# Patient Record
Sex: Female | Born: 1985 | Race: Black or African American | Hispanic: No | Marital: Single | Smoking: Never smoker
Health system: Southern US, Community
[De-identification: ages and names within clinical notes are randomized; demographics above are authoritative.]

## PROBLEM LIST (undated history)

## (undated) DIAGNOSIS — J45909 Unspecified asthma, uncomplicated: Secondary | ICD-10-CM

## (undated) DIAGNOSIS — N921 Excessive and frequent menstruation with irregular cycle: Secondary | ICD-10-CM

## (undated) HISTORY — PX: SKIN GRAFT: SHX250

## (undated) HISTORY — DX: Excessive and frequent menstruation with irregular cycle: N92.1

---
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
## 2004-08-22 ENCOUNTER — Emergency Department (HOSPITAL_COMMUNITY): Admission: EM | Admit: 2004-08-22 | Discharge: 2004-08-22 | Payer: Self-pay | Admitting: Family Medicine

## 2005-05-24 ENCOUNTER — Emergency Department (HOSPITAL_COMMUNITY): Admission: EM | Admit: 2005-05-24 | Discharge: 2005-05-25 | Payer: Self-pay | Admitting: Emergency Medicine

## 2006-11-14 ENCOUNTER — Emergency Department (HOSPITAL_COMMUNITY): Admission: EM | Admit: 2006-11-14 | Discharge: 2006-11-14 | Payer: Self-pay | Admitting: Emergency Medicine

## 2008-08-12 ENCOUNTER — Emergency Department (HOSPITAL_COMMUNITY): Admission: EM | Admit: 2008-08-12 | Discharge: 2008-08-12 | Payer: Self-pay | Admitting: Emergency Medicine

## 2010-03-09 ENCOUNTER — Emergency Department (HOSPITAL_COMMUNITY): Admission: EM | Admit: 2010-03-09 | Discharge: 2010-03-09 | Payer: Self-pay | Admitting: Emergency Medicine

## 2010-07-06 LAB — COMPREHENSIVE METABOLIC PANEL
ALT: 19 U/L (ref 0–35)
AST: 23 U/L (ref 0–37)
Calcium: 9.1 mg/dL (ref 8.4–10.5)
Chloride: 106 mEq/L (ref 96–112)
Creatinine, Ser: 0.85 mg/dL (ref 0.4–1.2)
GFR calc non Af Amer: >60 mL/min (ref >60)
Glucose, Bld: 93 mg/dL (ref 70–99)
Potassium: 4.2 mEq/L (ref 3.5–5.1)
Total Protein: 7.9 g/dL (ref 6.0–8.3)

## 2010-07-06 LAB — CBC
HCT: 37.4 % (ref 36.0–46.0)
MCHC: 33.6 g/dL (ref 30.0–36.0)
Platelets: 390 10*3/uL (ref 150–400)
RBC: 4.51 MIL/uL (ref 3.87–5.11)
RDW: 14.4 % (ref 11.5–15.5)
WBC: 5.2 10*3/uL (ref 4.0–10.5)

## 2010-07-06 LAB — DIFFERENTIAL
Basophils Absolute: 0 10*3/uL (ref 0.0–0.1)
Basophils Relative: 0 % (ref 0–1)
Eosinophils Relative: 1 % (ref 0–5)
Lymphs Abs: 1.8 10*3/uL (ref 0.7–4.0)
Monocytes Absolute: 0.4 10*3/uL (ref 0.1–1.0)
Neutro Abs: 3 10*3/uL (ref 1.7–7.7)

## 2010-07-06 LAB — URINALYSIS, ROUTINE W REFLEX MICROSCOPIC
Bilirubin Urine: NEGATIVE
Ketones, ur: NEGATIVE mg/dL
Protein, ur: 100 mg/dL — AB
pH: 8 (ref 5.0–8.0)

## 2010-07-06 LAB — URINE MICROSCOPIC-ADD ON

## 2010-07-06 LAB — LIPASE, BLOOD: Lipase: 30 U/L (ref 11–59)

## 2010-08-04 LAB — POCT I-STAT, CHEM 8
BUN: 12 mg/dL (ref 6–23)
Hemoglobin: 16 g/dL — ABNORMAL HIGH (ref 12.0–15.0)
Potassium: 3.3 mEq/L — ABNORMAL LOW (ref 3.5–5.1)
Sodium: 140 mEq/L (ref 135–145)
TCO2: 29 mmol/L (ref 0–100)

## 2010-08-04 LAB — POCT URINALYSIS DIP (DEVICE)
Nitrite: NEGATIVE
Urobilinogen, UA: 1 mg/dL (ref 0.0–1.0)
pH: 5.5 (ref 5.0–8.0)

## 2010-08-04 LAB — CBC
HCT: 42.5 % (ref 36.0–46.0)
Platelets: 395 10*3/uL (ref 150–400)

## 2010-08-04 LAB — POCT PREGNANCY, URINE: Preg Test, Ur: NEGATIVE

## 2010-08-04 LAB — DIFFERENTIAL
Basophils Absolute: 0 10*3/uL (ref 0.0–0.1)
Eosinophils Relative: 0 % (ref 0–5)
Lymphocytes Relative: 22 % (ref 12–46)
Monocytes Absolute: 0.6 10*3/uL (ref 0.1–1.0)
Monocytes Relative: 7 % (ref 3–12)
Neutrophils Relative %: 71 % (ref 43–77)

## 2013-03-29 ENCOUNTER — Other Ambulatory Visit (HOSPITAL_COMMUNITY): Payer: Self-pay | Admitting: *Deleted

## 2013-03-29 DIAGNOSIS — N6320 Unspecified lump in the left breast, unspecified quadrant: Secondary | ICD-10-CM

## 2013-04-02 ENCOUNTER — Ambulatory Visit (HOSPITAL_COMMUNITY): Payer: Self-pay

## 2013-04-04 ENCOUNTER — Other Ambulatory Visit: Payer: Self-pay

## 2016-07-12 ENCOUNTER — Encounter (HOSPITAL_COMMUNITY): Payer: Self-pay

## 2016-07-12 ENCOUNTER — Emergency Department (HOSPITAL_COMMUNITY)
Admission: EM | Admit: 2016-07-12 | Discharge: 2016-07-12 | Disposition: A | Discharge status: Home / Self Care | Payer: Self-pay | Attending: Emergency Medicine | Admitting: Emergency Medicine

## 2016-07-12 ENCOUNTER — Emergency Department (HOSPITAL_COMMUNITY): Payer: Self-pay

## 2016-07-12 DIAGNOSIS — J208 Acute bronchitis due to other specified organisms: Secondary | ICD-10-CM | POA: Insufficient documentation

## 2016-07-12 DIAGNOSIS — F17200 Nicotine dependence, unspecified, uncomplicated: Secondary | ICD-10-CM | POA: Insufficient documentation

## 2016-07-12 LAB — I-STAT TROPONIN, ED: Troponin i, poc: 0 ng/mL (ref 0.00–0.08)

## 2016-07-12 MED ORDER — ALBUTEROL SULFATE HFA 108 (90 BASE) MCG/ACT IN AERS: 2.0000 | INHALATION_SPRAY | RESPIRATORY_TRACT | Status: DC | PRN

## 2016-07-12 MED ORDER — ALBUTEROL SULFATE HFA 108 (90 BASE) MCG/ACT IN AERS
2.0000 | INHALATION_SPRAY | Freq: Once | RESPIRATORY_TRACT | Status: AC
  Administered 2016-07-12: 2 via RESPIRATORY_TRACT
  Filled 2016-07-12: qty 6.7

## 2016-07-12 NOTE — ED Triage Notes (Signed)
Patient complains of cough and congestion several days ago and now has chest pain with inspiration and movement and some back pain. NAD

## 2016-07-12 NOTE — ED Provider Notes (Signed)
[facility] Provider Note   CSN: [id number] Arrival date & time: 07/12/16  0757     History   Chief Complaint Chief Complaint  Patient presents with  . chest wall pain/ back pain    HPI Donna Carroll is a 31 y.o. female.  HPI  Pt presenting with c/o chest wall pain and upper back pain associated with coughing, nasal congestion and mild sore throat over the past several days.  Pt states she was initially concerned about a pulled muscle but became concerned that she had pneumonia.  No fever/chills.  No vomiting.  No weakness of arms. No difficulty breathing.  Hx of asthma as a child.  No nausea or diaphoresis.  Pain is worse with movement and different positions and with coughing.  No leg swelling. No hx of DVT/PE.  No recent travel/trauma/surgery- does not take OCPs.  Has not had any treatment prior to arrival.  There are no other associated systemic symptoms, there are no other alleviating or modifying factors.   History reviewed. No pertinent past medical history.  There are no active problems to display for this patient.   History reviewed. No pertinent surgical history.  OB History    No data available       Home Medications    Prior to Admission medications   Medication Sig Start Date End Date Taking? Authorizing Provider  ibuprofen (ADVIL,MOTRIN) 200 MG tablet Take 200 mg by mouth every 6 (six) hours as needed for mild pain.   Yes Historical Provider, MD    Family History No family history on file.  Social History Social History  Substance Use Topics  . Smoking status: Current Some Day Smoker  . Smokeless tobacco: Never Used  . Alcohol use Not on file     Allergies   Patient has no known allergies.   Review of Systems Review of Systems  ROS reviewed and all otherwise negative except for mentioned in HPI   Physical Exam Updated Vital Signs BP 112/78   Pulse 75   Temp 98.7 F (37.1 C) (Oral)   Resp 17   LMP 07/11/2016   SpO2 100%    Vitals reviewed Physical Exam  Physical Examination: General appearance - alert, well appearing, and in no distress Mental status - alert, oriented to person, place, and time Eyes - no conjunctival injection, no scleral icterus Mouth - mucous membranes moist, pharynx normal without lesions Neck - supple, no significant adenopathy Chest - clear to auscultation, no wheezes, rales or rhonchi, symmetric air entry Heart - normal rate, regular rhythm, normal S1, S2, no murmurs, rubs, clicks or gallops Abdomen - soft, nontender, nondistended, no masses or organomegaly Back- no midline or paraspinal tenderness to palpation of upper or lower back, no CVA tenderness Neurological - alert, oriented, normal speech Extremities - peripheral pulses normal, no pedal edema, no clubbing or cyanosis Skin - normal coloration and turgor, no rashes  ED Treatments / Results  Labs (all labs ordered are listed, but only abnormal results are displayed) Labs Reviewed  Rosezena Sensor, ED    EKG  EKG Interpretation  Date/Time:  Tuesday July 12 2016 08:00:44 EDT Ventricular Rate:  76 PR Interval:  130 QRS Duration: 74 QT Interval:  394 QTC Calculation: 443 R Axis:   29 Text Interpretation:  Normal sinus rhythm ST-t wave abnormality Abnormal ekg No significant change since last tracing Reconfirmed by [facility]  MD, Rondia Higginbotham [phone]) on 07/12/2016 9:55:42 AM       Radiology Dg Chest  2 View  Result Date: 07/12/2016 CLINICAL DATA:  Chest pain with cough and congestion EXAM: CHEST  2 VIEW COMPARISON:  May 24, 2005 FINDINGS: Lungs are clear. Heart size and pulmonary vascularity are normal. No adenopathy. No pneumothorax. No bone lesions. IMPRESSION: No edema or consolidation. Electronically Signed   By: Bretta BangWilliam  Woodruff III M.D.   On: 07/12/2016 08:35    Procedures Procedures (including critical care time)  Medications Ordered in ED Medications  albuterol (PROVENTIL HFA;VENTOLIN HFA) 108 (90 Base)  MCG/ACT inhaler 2 puff (2 puffs Inhalation Given 07/12/16 0956)     Initial Impression / Assessment and Plan / ED Course  I have reviewed the triage vital signs and the nursing notes.  Pertinent labs & imaging results that were available during my care of the patient were reviewed by me and considered in my medical decision making (see chart for details).     Pt presenting with c/o chest wall pain, upper back pain and cough, nasal congestion and sore throat.  She has no wheezing on exam, but given albuterol inhaler for cough.  Pt is PERC negative, very low risk for PE, heart score of zero, doubt ACS.  Discharged with strict return precautions.  Pt agreeable with plan.  Final Clinical Impressions(s) / ED Diagnoses   Final diagnoses:  Viral bronchitis    New Prescriptions Discharge Medication List as of 07/12/2016 10:17 AM       Jerelyn ScottMartha Linker, MD 07/12/16 [phone]

## 2016-07-12 NOTE — ED Notes (Signed)
Pt transporting to xray. NAD.  

## 2016-07-12 NOTE — Discharge Instructions (Signed)
Return to the ED with any concerns including difficulty breathing despite using albuterol every 4 hours, not drinking fluids, decreased urine output, vomiting and not able to keep down liquids or medications, decreased level of alertness/lethargy, or any other alarming symptoms °

## 2019-01-03 ENCOUNTER — Emergency Department (HOSPITAL_COMMUNITY)
Admission: EM | Admit: 2019-01-03 | Discharge: 2019-01-03 | Disposition: A | Discharge status: Home / Self Care | Payer: Self-pay | Attending: Pediatric Emergency Medicine | Admitting: Pediatric Emergency Medicine

## 2019-01-03 ENCOUNTER — Other Ambulatory Visit: Payer: Self-pay

## 2019-01-03 ENCOUNTER — Encounter (HOSPITAL_COMMUNITY): Payer: Self-pay | Admitting: Emergency Medicine

## 2019-01-03 DIAGNOSIS — K047 Periapical abscess without sinus: Secondary | ICD-10-CM | POA: Insufficient documentation

## 2019-01-03 DIAGNOSIS — K0889 Other specified disorders of teeth and supporting structures: Secondary | ICD-10-CM

## 2019-01-03 DIAGNOSIS — F17210 Nicotine dependence, cigarettes, uncomplicated: Secondary | ICD-10-CM | POA: Insufficient documentation

## 2019-01-03 DIAGNOSIS — K029 Dental caries, unspecified: Secondary | ICD-10-CM | POA: Insufficient documentation

## 2019-01-03 MED ORDER — AMOXICILLIN-POT CLAVULANATE 875-125 MG PO TABS: 1.0000 | ORAL_TABLET | Freq: Two times a day (BID) | ORAL | 0 refills | Status: AC

## 2019-01-03 MED ORDER — CHLORHEXIDINE GLUCONATE 0.12% ORAL RINSE (MEDLINE KIT): 15.0000 mL | Freq: Two times a day (BID) | OROMUCOSAL | 0 refills | Status: AC

## 2019-01-03 NOTE — ED Provider Notes (Signed)
[facility]   CSN: [id number] Arrival date & time: 01/03/19  2000     History   Chief Complaint Chief Complaint  Patient presents with  . Dental Pain    HPI Donna Carroll is a 33 y.o. female.     HPI   Patient is a 33 year old female who presents emergency department today for evaluation of right lower dental pain.  States 2 days ago she noted she had pain and swelling in the right lower jaw.  She has history of fractured tooth on that side.  She is tried interventions for symptoms.  She has had no fevers.  No difficulty opening her mouth or swallowing.  History reviewed. No pertinent past medical history.  There are no active problems to display for this patient.   History reviewed. No pertinent surgical history.   OB History   No obstetric history on file.      Home Medications    Prior to Admission medications   Medication Sig Start Date End Date Taking? Authorizing Provider  naproxen sodium (ALEVE) 220 MG tablet Take 220 mg by mouth as needed (pain).   Yes [provider]    Family History No family history on file.  Social History Social History   Tobacco Use  . Smoking status: Current Some Day Smoker  . Smokeless tobacco: Never Used  Substance Use Topics  . Alcohol use: Not on file  . Drug use: Yes    Types: Marijuana     Allergies   Patient has no known allergies.   Review of Systems Review of Systems  Constitutional: Negative for fever.  HENT: Positive for dental problem.      Physical Exam Updated Vital Signs BP (!) 160/88 (BP Location: Left Arm)   Pulse 76   Temp 98.7 F (37.1 C) (Oral)   Resp 16   SpO2 99%   Physical Exam Vitals signs and nursing note reviewed.  Constitutional:      General: She is not in acute distress.    Appearance: She is well-developed.  HENT:     Head: Normocephalic and atraumatic.     Mouth/Throat:     Comments: Right lower molar is  fractured with dental carie. Tooth is TTP. There is fluctuance to the right lower gumline that is also TTP. No sublingual or submandibular swelling. No trismus.  Eyes:     Conjunctiva/sclera: Conjunctivae normal.  Neck:     Musculoskeletal: Neck supple.  Cardiovascular:     Rate and Rhythm: Normal rate.  Pulmonary:     Effort: Pulmonary effort is normal.  Musculoskeletal: Normal range of motion.  Skin:    General: Skin is warm and dry.  Neurological:     Mental Status: She is alert.      ED Treatments / Results  Labs (all labs ordered are listed, but only abnormal results are displayed) Labs Reviewed - No data to display  EKG None  Radiology No results found.  Procedures .Marland Kitchen.Incision and Drainage  Date/Time: 01/03/2019 9:54 PM Performed by: Karrie Meresouture, Sharlon Pfohl S, PA-C Authorized by: Karrie Meresouture, Lanell Dubie S, PA-C   Consent:    Consent obtained:  Verbal   Consent given by:  Patient   Risks discussed:  Bleeding, incomplete drainage and pain   Alternatives discussed:  No treatment Location:    Type:  Abscess   Location:  Mouth Anesthesia (see MAR for exact dosages):    Anesthesia method:  Topical application   Topical  anesthetic:  Benzocaine gel Procedure type:    Complexity:  Simple Procedure details:    Needle aspiration: no     Incision types:  Stab incision   Incision depth:  Dermal   Scalpel blade:  11   Drainage:  Bloody   Drainage amount:  Scant   Wound treatment:  Wound left open Post-procedure details:    Patient tolerance of procedure:  Tolerated well, no immediate complications   (including critical care time)  Medications Ordered in ED Medications - No data to display   Initial Impression / Assessment and Plan / ED Course  I have reviewed the triage vital signs and the nursing notes.  Pertinent labs & imaging results that were available during my care of the patient were reviewed by me and considered in my medical decision making (see chart for  details).  Final Clinical Impressions(s) / ED Diagnoses   Final diagnoses:  None   Patient with toothache.  Abscess noted to the gumline. I&D attempted with scant bloody discharge noted. Exam unconcerning for Ludwig's angina or spread of infection.  Will treat with augmentin and pain medicine.  Urged patient to follow-up with dentist.     ED Discharge Orders    None       Bishop Dublin 01/03/19 2156    Brent Bulla, MD 01/03/19 2205

## 2019-01-03 NOTE — ED Notes (Signed)
Patient verbalizes understanding of discharge instructions. Opportunity for questioning and answers were provided. Armband removed by staff, pt discharged from ED ambulatory.   

## 2019-01-03 NOTE — Discharge Instructions (Signed)
You were given a prescription for antibiotics. Please take the antibiotic prescription fully.   Please follow-up with a dentist in the next 5 to 7 days for reevaluation.  If you do not have a dentist, resources were provided for dentist in the area in your discharge summary.  Please contact one of the offices that are listed and make an appointment for follow-up.  Please return to the emergency department for any new or worsening symptoms.  

## 2019-01-03 NOTE — ED Triage Notes (Signed)
Pt c/o dental pain and swelling to the right jaw x 2 days. States she has a hole in her tooth.

## 2019-03-08 ENCOUNTER — Ambulatory Visit (HOSPITAL_COMMUNITY)
Admission: EM | Admit: 2019-03-08 | Discharge: 2019-03-08 | Disposition: A | Discharge status: Home / Self Care | Payer: Self-pay | Attending: Emergency Medicine | Admitting: Emergency Medicine

## 2019-03-08 ENCOUNTER — Encounter (HOSPITAL_COMMUNITY): Payer: Self-pay | Admitting: Emergency Medicine

## 2019-03-08 ENCOUNTER — Other Ambulatory Visit: Payer: Self-pay

## 2019-03-08 DIAGNOSIS — Z3202 Encounter for pregnancy test, result negative: Secondary | ICD-10-CM

## 2019-03-08 DIAGNOSIS — N939 Abnormal uterine and vaginal bleeding, unspecified: Secondary | ICD-10-CM | POA: Insufficient documentation

## 2019-03-08 HISTORY — DX: Unspecified asthma, uncomplicated: J45.909

## 2019-03-08 LAB — CBC
HCT: 38 % (ref 36.0–46.0)
Hemoglobin: 12.1 g/dL (ref 12.0–15.0)
MCH: 25.7 pg — ABNORMAL LOW (ref 26.0–34.0)
MCHC: 31.8 g/dL (ref 30.0–36.0)
MCV: 80.9 fL (ref 80.0–100.0)
Platelets: 489 10*3/uL — ABNORMAL HIGH (ref 150–400)
RBC: 4.7 MIL/uL (ref 3.87–5.11)
RDW: 14.9 % (ref 11.5–15.5)
WBC: 8.1 10*3/uL (ref 4.0–10.5)
nRBC: 0 % (ref 0.0–0.2)

## 2019-03-08 LAB — POCT URINALYSIS DIP (DEVICE)
Bilirubin Urine: NEGATIVE
Glucose, UA: NEGATIVE mg/dL
Ketones, ur: NEGATIVE mg/dL
Nitrite: NEGATIVE
Protein, ur: NEGATIVE mg/dL
Specific Gravity, Urine: 1.02 (ref 1.005–1.030)
Urobilinogen, UA: 0.2 mg/dL (ref 0.0–1.0)
pH: 6 (ref 5.0–8.0)

## 2019-03-08 LAB — POCT PREGNANCY, URINE: Preg Test, Ur: NEGATIVE

## 2019-03-08 MED ORDER — NAPROXEN 500 MG PO TABS: 500.0000 mg | ORAL_TABLET | Freq: Two times a day (BID) | ORAL | 0 refills | Status: DC

## 2019-03-08 MED ORDER — FERROUS SULFATE 325 (65 FE) MG PO TABS: 325.0000 mg | ORAL_TABLET | Freq: Every day | ORAL | 0 refills | Status: DC

## 2019-03-08 NOTE — ED Triage Notes (Addendum)
Patient reports she has vaginal bleeding. Patient reports bleeding started September 29.  Patient reports having bled every single day since onset.  Patient feels weak and lightheaded.  3 days ago had right lower quadrant discomfort.

## 2019-03-08 NOTE — Discharge Instructions (Signed)
I will call you if there is any concerning findings on your blood test or you vaginal swab. The vaginal swab does take 2-3 days to result.  You may use naproxen twice a day as needed to help with may, may help decrease your bleeding some.  Iron supplementation may also be helpful, may cause constipation.  If any worsening of pain, weakness, fevers, passing out or otherwise worsening please go to the ER.  Please follow up with gynecology for recheck as may need further evaluation and management if this persists.

## 2019-03-08 NOTE — ED Provider Notes (Signed)
[facility]    CSN: [id number] Arrival date & time: 03/08/19  1730      History   Chief Complaint Chief Complaint  Patient presents with  . Vaginal Bleeding    HPI ROME SCHLAUCH is a 33 y.o. female.   ADAHLIA STEMBRIDGE presents with complaints of persistent vaginal bleeding. LMP was 9/29, but bleeding never stopped. She states it was heavy bleeding for approximately 2-3 weeks and then decreased to "normal period" amount of daily  Bleeding. Today the bleeding is the lightest it has been yet. Some RLQ pain for the past three days. No worsening of this. No nausea or vomiting. No diarrhea or constipation. No fevers. Hasn't tried any medications for pain. She can feel weak and light headed at night. No LOC or syncope ever, however. No other vaginal discharge or odor. Denies any previous similar. She states she is not currently sexually active. She is not on any form of birth control. Denies any urinary symptoms. Some mild back ache. She has appointment with gynecology on 12/2.     ROS per HPI, negative if not otherwise mentioned.      Past Medical History:  Diagnosis Date  . Asthma     There are no active problems to display for this patient.   Past Surgical History:  Procedure Laterality Date  . SKIN GRAFT Right    right hand    OB History   No obstetric history on file.      Home Medications    Prior to Admission medications   Medication Sig Start Date End Date Taking? Authorizing Provider  ferrous sulfate 325 (65 FE) MG tablet Take 1 tablet (325 mg total) by mouth daily. 03/08/19   Georgetta Haber, NP  naproxen (NAPROSYN) 500 MG tablet Take 1 tablet (500 mg total) by mouth 2 (two) times daily. 03/08/19   Georgetta Haber, NP    Family History Family History  Problem Relation Age of Onset  . Diabetes Mother   . Hypertension Mother     Social History Social History   Tobacco Use  . Smoking status: Never Smoker  . Smokeless tobacco: Never  Used  Substance Use Topics  . Alcohol use: Yes  . Drug use: Yes    Types: Marijuana     Allergies   Patient has no known allergies.   Review of Systems Review of Systems   Physical Exam Triage Vital Signs ED Triage Vitals  Enc Vitals Group     BP 03/08/19 1805 110/62     Pulse Rate 03/08/19 1805 82     Resp 03/08/19 1805 18     Temp 03/08/19 1805 98.4 F (36.9 C)     Temp Source 03/08/19 1805 Oral     SpO2 03/08/19 1805 100 %     Weight --      Height --      Head Circumference --      Peak Flow --      Pain Score 03/08/19 1800 7     Pain Loc --      Pain Edu? --      Excl. in GC? --    No data found.  Updated Vital Signs BP 110/62 (BP Location: Right Arm) Comment (BP Location): large cuff  Pulse 82   Temp 98.4 F (36.9 C) (Oral)   Resp 18   LMP 12/05/2018 Comment: patient reports she had her first period in second grade/33 years old  SpO2 100%  Physical Exam Constitutional:      General: She is not in acute distress.    Appearance: She is well-developed.  Cardiovascular:     Rate and Rhythm: Normal rate.  Pulmonary:     Effort: Pulmonary effort is normal.  Abdominal:     Tenderness: There is abdominal tenderness in the right lower quadrant. There is no right CVA tenderness, left CVA tenderness, guarding or rebound.       Comments: Generalized mild RLQ tenderness with fullness palpable although no specific palpable masses; mild pain without rebound or guarding; no straight leg strike pain and full movement of abdomen without increased pain  Skin:    General: Skin is warm and dry.  Neurological:     Mental Status: She is alert and oriented to person, place, and time.      UC Treatments / Results  Labs (all labs ordered are listed, but only abnormal results are displayed) Labs Reviewed  POCT URINALYSIS DIP (DEVICE) - Abnormal; Notable for the following components:      Result Value   Hgb urine dipstick LARGE (*)    Leukocytes,Ua TRACE (*)     All other components within normal limits  URINE CULTURE  CBC  POC URINE PREG, ED    EKG   Radiology No results found.  Procedures Procedures (including critical care time)  Medications Ordered in UC Medications - No data to display  Initial Impression / Assessment and Plan / UC Course  I have reviewed the triage vital signs and the nursing notes.  Pertinent labs & imaging results that were available during my care of the patient were reviewed by me and considered in my medical decision making (see chart for details).     Abnormal vaginal bleeding, no sexual activity, not on birth control. Cbc pending. Encouraged use of naproxen, iron supplementation. Bleeding has improved today. I do recommend further evaluation with gynecology. Return precautions provided. Patient verbalized understanding and agreeable to plan.   Final Clinical Impressions(s) / UC Diagnoses   Final diagnoses:  Abnormal uterine bleeding (AUB)     Discharge Instructions     I will call you if there is any concerning findings on your blood test or you vaginal swab. The vaginal swab does take 2-3 days to result.  You may use naproxen twice a day as needed to help with may, may help decrease your bleeding some.  Iron supplementation may also be helpful, may cause constipation.  If any worsening of pain, weakness, fevers, passing out or otherwise worsening please go to the ER.  Please follow up with gynecology for recheck as may need further evaluation and management if this persists.    ED Prescriptions    Medication Sig Dispense Auth. Provider   naproxen (NAPROSYN) 500 MG tablet Take 1 tablet (500 mg total) by mouth 2 (two) times daily. 30 tablet Augusto Gamble B, NP   ferrous sulfate 325 (65 FE) MG tablet Take 1 tablet (325 mg total) by mouth daily. 30 tablet Zigmund Gottron, NP     PDMP not reviewed this encounter.   Zigmund Gottron, NP 03/08/19 1840

## 2019-03-10 LAB — URINE CULTURE: Culture: 20000 — AB

## 2019-03-12 LAB — CERVICOVAGINAL ANCILLARY ONLY
Bacterial vaginitis: NEGATIVE
Candida vaginitis: NEGATIVE
Chlamydia: NEGATIVE
Neisseria Gonorrhea: NEGATIVE
Trichomonas: NEGATIVE

## 2019-03-26 ENCOUNTER — Telehealth: Payer: Self-pay | Admitting: Family Medicine

## 2019-03-26 ENCOUNTER — Telehealth: Payer: Self-pay

## 2019-03-26 NOTE — Telephone Encounter (Signed)
Attempted to call patient about changes made in the office. I was not able to leave a message because her voice mailbox was full.

## 2019-03-26 NOTE — Telephone Encounter (Signed)
Pt called stating that she has an appt tomorrow for long periods and she is still bleeding does need to come to come to the appt.  Notified pt that she needs to come to her appt scheduled on 03/27/19 @ 1430 so that the provider will be able to see how much she is bleeding.  I strongly encouraged pt to come to her appt for management.  Pt verbalized understanding with no further questions.   Mel Almond, RN 03/26/19

## 2019-03-27 ENCOUNTER — Encounter: Payer: Self-pay | Admitting: Obstetrics & Gynecology

## 2019-03-27 ENCOUNTER — Ambulatory Visit (INDEPENDENT_AMBULATORY_CARE_PROVIDER_SITE_OTHER): Payer: Self-pay | Admitting: Obstetrics & Gynecology

## 2019-03-27 ENCOUNTER — Other Ambulatory Visit: Payer: Self-pay

## 2019-03-27 VITALS — BP 118/80 | HR 77 | Ht 64.0 in | Wt 203.2 lb

## 2019-03-27 DIAGNOSIS — N938 Other specified abnormal uterine and vaginal bleeding: Secondary | ICD-10-CM

## 2019-03-27 DIAGNOSIS — N939 Abnormal uterine and vaginal bleeding, unspecified: Secondary | ICD-10-CM | POA: Insufficient documentation

## 2019-03-27 MED ORDER — MEDROXYPROGESTERONE ACETATE 10 MG PO TABS: 20.0000 mg | ORAL_TABLET | Freq: Every day | ORAL | 2 refills | Status: DC

## 2019-03-27 NOTE — Progress Notes (Signed)
Patient ID: Donna Carroll, female   DOB: Apr 26, 1985, 33 y.o.   MRN: [id number]  Chief Complaint  Patient presents with  . Gynecologic Exam   F/u for DUB for 9 weeks HPI Donna Carroll is a 33 y.o. female.  [id number] Patient's last menstrual period was 01/22/2019 (exact date). Her bleeding has persisted but is lighter now, with mild cramps. She states she doesn't need BCM, never tried to conceive HPI  Past Medical History:  Diagnosis Date  . Asthma     Past Surgical History:  Procedure Laterality Date  . SKIN GRAFT Right    right hand    Family History  Problem Relation Age of Onset  . Diabetes Mother   . Hypertension Mother     Social History Social History   Tobacco Use  . Smoking status: Never Smoker  . Smokeless tobacco: Never Used  Substance Use Topics  . Alcohol use: Yes  . Drug use: Yes    Types: Marijuana    No Known Allergies  Current Outpatient Medications  Medication Sig Dispense Refill  . ferrous sulfate 325 (65 FE) MG tablet Take 1 tablet (325 mg total) by mouth daily. (Patient not taking: Reported on 03/27/2019) 30 tablet 0  . naproxen (NAPROSYN) 500 MG tablet Take 1 tablet (500 mg total) by mouth 2 (two) times daily. (Patient not taking: Reported on 03/27/2019) 30 tablet 0   No current facility-administered medications for this visit.     Review of Systems Review of Systems  Constitutional: Negative.   Gastrointestinal: Positive for diarrhea.  Endocrine:       Facial hirsutism  Genitourinary: Positive for menstrual problem. Negative for vaginal discharge.    Blood pressure 118/80, pulse 77, height 5\' 4"  (1.626 m), weight 203 lb 3.2 oz (92.2 kg), last menstrual period 01/22/2019.  Physical Exam Physical Exam Vitals signs and nursing note reviewed. Exam conducted with a chaperone present Donna Carroll).  Constitutional:      Appearance: She is obese. She is not ill-appearing.  Pulmonary:     Effort: Pulmonary effort is normal.  Abdominal:   General: There is no distension.  Genitourinary:    Comments: Pelvic exam: blood present,normal external genitalia, vulva, vagina, cervix, uterus and adnexa.  Neurological:     Mental Status: She is alert.     Data Reviewed CBC    Component Value Date/Time   WBC 8.1 03/08/2019 1810   RBC 4.70 03/08/2019 1810   HGB 12.1 03/08/2019 1810   HCT 38.0 03/08/2019 1810   PLT 489 (H) 03/08/2019 1810   MCV 80.9 03/08/2019 1810   MCH 25.7 (L) 03/08/2019 1810   MCHC 31.8 03/08/2019 1810   RDW 14.9 03/08/2019 1810   LYMPHSABS 1.8 03/09/2010 1300   MONOABS 0.4 03/09/2010 1300   EOSABS 0.0 03/09/2010 1300   BASOSABS 0.0 03/09/2010 1300     Assessment Patient Active Problem List   Diagnosis Date Noted  . DUB (dysfunctional uterine bleeding) 03/27/2019    Plan Pelvic US Provera for 30 days po RTC 6 weeks    Donna Carroll 03/27/2019, 3:08 PM

## 2019-04-01 ENCOUNTER — Encounter: Payer: Self-pay | Admitting: Family Medicine

## 2019-04-02 ENCOUNTER — Other Ambulatory Visit: Payer: Self-pay

## 2019-04-02 ENCOUNTER — Ambulatory Visit (HOSPITAL_COMMUNITY)
Admission: RE | Admit: 2019-04-02 | Discharge: 2019-04-02 | Disposition: A | Discharge status: Home / Self Care | Payer: Self-pay | Source: Ambulatory Visit | Attending: Obstetrics & Gynecology | Admitting: Obstetrics & Gynecology

## 2019-04-02 DIAGNOSIS — N938 Other specified abnormal uterine and vaginal bleeding: Secondary | ICD-10-CM | POA: Insufficient documentation

## 2019-04-12 ENCOUNTER — Encounter: Payer: Self-pay | Admitting: *Deleted

## 2019-05-31 ENCOUNTER — Encounter: Payer: Self-pay | Admitting: Obstetrics & Gynecology

## 2019-05-31 ENCOUNTER — Ambulatory Visit: Payer: Self-pay | Admitting: Obstetrics & Gynecology

## 2019-08-11 ENCOUNTER — Other Ambulatory Visit: Payer: Self-pay

## 2019-08-11 ENCOUNTER — Emergency Department (HOSPITAL_COMMUNITY): Payer: Medicaid Other

## 2019-08-11 ENCOUNTER — Emergency Department (HOSPITAL_COMMUNITY)
Admission: EM | Admit: 2019-08-11 | Discharge: 2019-08-11 | Disposition: A | Discharge status: Home / Self Care | Payer: Medicaid Other | Attending: Emergency Medicine | Admitting: Emergency Medicine

## 2019-08-11 ENCOUNTER — Encounter (HOSPITAL_COMMUNITY): Payer: Self-pay | Admitting: Emergency Medicine

## 2019-08-11 DIAGNOSIS — G8921 Chronic pain due to trauma: Secondary | ICD-10-CM | POA: Insufficient documentation

## 2019-08-11 DIAGNOSIS — M25512 Pain in left shoulder: Secondary | ICD-10-CM | POA: Insufficient documentation

## 2019-08-11 DIAGNOSIS — W1789XD Other fall from one level to another, subsequent encounter: Secondary | ICD-10-CM | POA: Insufficient documentation

## 2019-08-11 MED ORDER — NAPROXEN 500 MG PO TABS: 500.0000 mg | ORAL_TABLET | Freq: Two times a day (BID) | ORAL | 0 refills | Status: DC

## 2019-08-11 NOTE — ED Notes (Signed)
Patient verbalizes understanding of discharge instructions . Opportunity for questions and answers were provided . Armband removed by staff ,Pt discharged from ED. W/C  offered at D/C  and Declined W/C at D/C and was escorted to lobby by RN.  

## 2019-08-11 NOTE — Discharge Instructions (Addendum)
Get help right away if: Your arm, hand, or fingers: Tingle. Become numb. Become swollen. Become painful. Turn white or blue. 

## 2019-08-11 NOTE — ED Triage Notes (Signed)
C/o L shoulder pain since falling off a [employer] ramp on 3/26.  States she hasn't had it x-rayed since fall.

## 2019-08-11 NOTE — ED Provider Notes (Signed)
[facility] EMERGENCY DEPARTMENT Provider Note   CSN: [id number] Arrival date & time: 08/11/19  1412     History Chief Complaint  Patient presents with  . Shoulder Pain    Donna Carroll is a 34 y.o. female who injured her left shoulder 1 month ago.  Patient fell off of a hand and she states that she felt her left shoulder pop upward when S3, extended arm.  Since that time she has had pain in the supraclavicular region and occasional feels sharp, tingling pain rating down the front of her arm.  She denies any weakness, loss of grip strength.  She has been taking naproxen which has been helpful.  Chest pain or shortness of breath.  HPI     Past Medical History:  Diagnosis Date  . Asthma     Patient Active Problem List   Diagnosis Date Noted  . DUB (dysfunctional uterine bleeding) 03/27/2019    Past Surgical History:  Procedure Laterality Date  . SKIN GRAFT Right    right hand     OB History    Gravida  0   Para  0   Term  0   Preterm  0   AB  0   Living  0     SAB  0   TAB  0   Ectopic  0   Multiple  0   Live Births  0           Family History  Problem Relation Age of Onset  . Diabetes Mother   . Hypertension Mother     Social History   Tobacco Use  . Smoking status: Never Smoker  . Smokeless tobacco: Never Used  Substance Use Topics  . Alcohol use: Yes  . Drug use: Yes    Types: Marijuana    Home Medications Prior to Admission medications   Medication Sig Start Date End Date Taking? Authorizing Provider  ferrous sulfate 325 (65 FE) MG tablet Take 1 tablet (325 mg total) by mouth daily. Patient not taking: Reported on 03/27/2019 03/08/19   Linus Mako B, NP  medroxyPROGESTERone (PROVERA) 10 MG tablet Take 2 tablets (20 mg total) by mouth daily. For 30 days 03/27/19   Adam Phenix, MD  naproxen (NAPROSYN) 500 MG tablet Take 1 tablet (500 mg total) by mouth 2 (two) times daily. Patient not taking: Reported on  03/27/2019 03/08/19   Georgetta Haber, NP    Allergies    Patient has no known allergies.  Review of Systems   Review of Systems  Constitutional: Negative for fatigue.  Respiratory: Negative for cough.   Cardiovascular: Negative for chest pain.  Musculoskeletal:       Left shoulder pain  Neurological: Negative for weakness and numbness.      Physical Exam Updated Vital Signs BP 133/88 (BP Location: Right Arm)   Pulse 98   Temp 98.8 F (37.1 C) (Oral)   Resp 14   LMP 07/21/2019   SpO2 99%   Physical Exam Vitals and nursing note reviewed.  Constitutional:      General: She is not in acute distress.    Appearance: She is well-developed. She is not diaphoretic.  HENT:     Head: Normocephalic and atraumatic.  Eyes:     General: No scleral icterus.    Conjunctiva/sclera: Conjunctivae normal.  Cardiovascular:     Rate and Rhythm: Normal rate and regular rhythm.     Heart sounds: Normal heart sounds.  No murmur. No friction rub. No gallop.   Pulmonary:     Effort: Pulmonary effort is normal. No respiratory distress.     Breath sounds: Normal breath sounds.  Abdominal:     General: Bowel sounds are normal. There is no distension.     Palpations: Abdomen is soft. There is no mass.     Tenderness: There is no abdominal tenderness. There is no guarding.  Musculoskeletal:     Cervical back: Normal range of motion.     Comments: Normal radial pulses bilaterally, strong equal grip strength.  No bony tenderness to palpation along the clavicle, scapula or coracoid process.  Full range of motion of bilateral shoulders, pain with internal rotation and reaching overhead on the left.  There is tenderness to palpation in the supraclavicular region.  No swelling bruising or other abnormalities  Skin:    General: Skin is warm and dry.  Neurological:     Mental Status: She is alert and oriented to person, place, and time.  Psychiatric:        Behavior: Behavior normal.     ED Results  / Procedures / Treatments   Labs (all labs ordered are listed, but only abnormal results are displayed) Labs Reviewed - No data to display  EKG None  Radiology DG Shoulder Left  Result Date: 08/11/2019 CLINICAL DATA:  Left shoulder pain since 07/19/2019, fell off U whole ramp. Patient reports scapular pain. EXAM: LEFT SHOULDER - 2+ VIEW COMPARISON:  None. FINDINGS: There is no evidence of fracture or dislocation. There is no evidence of arthropathy or other focal bone abnormality. Soft tissues are unremarkable. IMPRESSION: Negative radiographs of the left shoulder. Electronically Signed   By: Keith Rake M.D.   On: 08/11/2019 15:07    Procedures Procedures (including critical care time)  Medications Ordered in ED Medications - No data to display  ED Course  I have reviewed the triage vital signs and the nursing notes.  Pertinent labs & imaging results that were available during my care of the patient were reviewed by me and considered in my medical decision making (see chart for details).    MDM Rules/Calculators/A&P                     Patient here with left shoulder injury that occurred 1 month ago.  I personally interpreted and reviewed images of the left shoulder which showed no acute abnormalities. I suspect the patient actually had a strain or partial tear of one of her left scalene muscles given the mechanism of her injury and tenderness in the supraclavicular region.  I recommended supportive care, RICE and close outpatient follow-up with a sports medicine doctor.  Discussed return precautions.  Appears appropriate for discharge at this time.   Final Clinical Impression(s) / ED Diagnoses Final diagnoses:  None    Rx / DC Orders ED Discharge Orders    None       Donna Mail, PA-C 08/11/19 1705    Tegeler, Gwenyth Allegra, MD 08/12/19 [phone]

## 2019-09-25 ENCOUNTER — Other Ambulatory Visit: Payer: Self-pay

## 2019-09-25 ENCOUNTER — Ambulatory Visit (INDEPENDENT_AMBULATORY_CARE_PROVIDER_SITE_OTHER): Payer: 59 | Admitting: Family Medicine

## 2019-09-25 VITALS — BP 134/86 | Ht 63.0 in | Wt 175.0 lb

## 2019-09-25 DIAGNOSIS — M25512 Pain in left shoulder: Secondary | ICD-10-CM

## 2019-09-25 NOTE — Patient Instructions (Signed)
You have strained and bruised your rotator cuff. Try to avoid painful activities (overhead activities, lifting with extended arm) as much as possible. When you run out of the naproxen you can take Aleve 2 tabs twice a day with food. Can take tylenol in addition to this. Consider physical therapy with transition to home exercise program. Do home exercise program with theraband and scapular stabilization exercises daily 3 sets of 10 once a day. If not improving at follow-up we will consider further imaging, injection, physical therapy, and/or nitro patches. Follow up with me in 5-6 weeks but call me sooner if you want to do physical therapy.

## 2019-09-26 ENCOUNTER — Encounter: Payer: Self-pay | Admitting: Family Medicine

## 2019-09-26 NOTE — Progress Notes (Signed)
PCP: Patient, No Pcp Per  Subjective:   HPI: Patient is a 34 y.o. female here for left shoulder pain.  Patient reports on 3/26 she was on a U-haul ramp that was slippery, she slipped and jammed her left shoulder with feeling that 'shoulder went up' in superior direction. No prior injuries to this shoulder. She is left handed. Worse with overhead motions. Had radiographs in urgent care that were normal. No radiation into left upper extremity, numbness.  Past Medical History:  Diagnosis Date   Asthma     Current Outpatient Medications on File Prior to Visit  Medication Sig Dispense Refill   ferrous sulfate 325 (65 FE) MG tablet Take 1 tablet (325 mg total) by mouth daily. (Patient not taking: Reported on 03/27/2019) 30 tablet 0   medroxyPROGESTERone (PROVERA) 10 MG tablet Take 2 tablets (20 mg total) by mouth daily. For 30 days 60 tablet 2   naproxen (NAPROSYN) 500 MG tablet Take 1 tablet (500 mg total) by mouth 2 (two) times daily. 30 tablet 0   No current facility-administered medications on file prior to visit.    Past Surgical History:  Procedure Laterality Date   SKIN GRAFT Right    right hand    No Known Allergies  Social History   Socioeconomic History   Marital status: Single    Spouse name: Not on file   Number of children: Not on file   Years of education: Not on file   Highest education level: Not on file  Occupational History   Not on file  Tobacco Use   Smoking status: Never Smoker   Smokeless tobacco: Never Used  Substance and Sexual Activity   Alcohol use: Yes   Drug use: Yes    Types: Marijuana   Sexual activity: Not on file  Other Topics Concern   Not on file  Social History Narrative   Not on file   Social Determinants of Health   Financial Resource Strain:    Difficulty of Paying Living Expenses:   Food Insecurity:    Worried About Charity fundraiser in the Last Year:    Arboriculturist in the Last Year:    Transportation Needs:    Film/video editor (Medical):    Lack of Transportation (Non-Medical):   Physical Activity:    Days of Exercise per Week:    Minutes of Exercise per Session:   Stress:    Feeling of Stress :   Social Connections:    Frequency of Communication with Friends and Family:    Frequency of Social Gatherings with Friends and Family:    Attends Religious Services:    Active Member of Clubs or Organizations:    Attends Music therapist:    Marital Status:   Intimate Partner Violence:    Fear of Current or Ex-Partner:    Emotionally Abused:    Physically Abused:    Sexually Abused:     Family History  Problem Relation Age of Onset   Diabetes Mother    Hypertension Mother     BP 134/86    Ht 5\' 3"  (1.6 m)    Wt 175 lb (79.4 kg)    BMI 31.00 kg/m   Review of Systems: See HPI above.     Objective:  Physical Exam:  Gen: NAD, comfortable in exam room  Left shoulder: No swelling, ecchymoses.  No gross deformity. No TTP. FROM with mild painful arc. Negative Hawkins, Neers. Negative Yergasons. Strength 5/5  with empty can and resisted internal/external rotation.  Pain with empty can and ER. Negative apprehension. Negative sulcus, o'briens. NV intact distally.   Assessment & Plan:  1. Left shoulder pain - radiographs negative for bony abnormalities.  Exam is reassuring against rotator cuff tear though consistent with strain, contusion of supraspinatus and infraspinatus.  Start home exercise program daily.  Naproxen, tylenol.  Consider physical therapy, ultrasound if not improving as next steps.  F/u in 5-6 weeks.

## 2019-10-30 ENCOUNTER — Ambulatory Visit: Payer: 59 | Admitting: Family Medicine

## 2019-11-06 ENCOUNTER — Ambulatory Visit (INDEPENDENT_AMBULATORY_CARE_PROVIDER_SITE_OTHER): Payer: 59 | Admitting: Family Medicine

## 2019-11-06 ENCOUNTER — Other Ambulatory Visit: Payer: Self-pay

## 2019-11-06 VITALS — BP 118/80 | Ht 64.0 in | Wt 178.0 lb

## 2019-11-06 DIAGNOSIS — M25512 Pain in left shoulder: Secondary | ICD-10-CM | POA: Diagnosis not present

## 2019-11-06 MED ORDER — DICLOFENAC SODIUM 75 MG PO TBEC: 75.0000 mg | DELAYED_RELEASE_TABLET | Freq: Two times a day (BID) | ORAL | 1 refills | Status: DC

## 2019-11-06 NOTE — Patient Instructions (Signed)
Try to avoid painful activities (overhead activities, lifting with extended arm) as much as possible. Diclofenac 75mg  twice a day with food only if needed. Can take tylenol in addition to this. Consider physical therapy with transition to home exercise program. Do home exercise program with theraband and scapular stabilization exercises daily 3 sets of 10 once a day. If not improving at follow-up we will consider imaging, injection, physical therapy, and/or nitro patches. Follow up with me in 6 weeks but call me sooner if you want to do physical therapy.

## 2019-11-07 ENCOUNTER — Encounter: Payer: Self-pay | Admitting: Family Medicine

## 2019-11-07 NOTE — Progress Notes (Signed)
PCP: Patient, No Pcp Per  Subjective:   HPI: Patient is a 34 y.o. female here for left shoulder pain.  6/2: Patient reports on 3/26 she was on a U-haul ramp that was slippery, she slipped and jammed her left shoulder with feeling that 'shoulder went up' in superior direction. No prior injuries to this shoulder. She is left handed. Worse with overhead motions. Had radiographs in urgent care that were normal. No radiation into left upper extremity, numbness.  7/14: Patient reports she's improved compared to last visit. Doing home exercises. Pain worse in evenings and feels she needs medication then to help her get to sleep. Sometimes waking her up at night. Overall pleased with her progress.  Past Medical History:  Diagnosis Date   Asthma     Current Outpatient Medications on File Prior to Visit  Medication Sig Dispense Refill   ferrous sulfate 325 (65 FE) MG tablet Take 1 tablet (325 mg total) by mouth daily. (Patient not taking: Reported on 03/27/2019) 30 tablet 0   medroxyPROGESTERone (PROVERA) 10 MG tablet Take 2 tablets (20 mg total) by mouth daily. For 30 days 60 tablet 2   naproxen (NAPROSYN) 500 MG tablet Take 1 tablet (500 mg total) by mouth 2 (two) times daily. 30 tablet 0   No current facility-administered medications on file prior to visit.    Past Surgical History:  Procedure Laterality Date   SKIN GRAFT Right    right hand    No Known Allergies  Social History   Socioeconomic History   Marital status: Single    Spouse name: Not on file   Number of children: Not on file   Years of education: Not on file   Highest education level: Not on file  Occupational History   Not on file  Tobacco Use   Smoking status: Never Smoker   Smokeless tobacco: Never Used  Substance and Sexual Activity   Alcohol use: Yes   Drug use: Yes    Types: Marijuana   Sexual activity: Not on file  Other Topics Concern   Not on file  Social History Narrative    Not on file   Social Determinants of Health   Financial Resource Strain:    Difficulty of Paying Living Expenses:   Food Insecurity:    Worried About Programme researcher, broadcasting/film/video in the Last Year:    Barista in the Last Year:   Transportation Needs:    Freight forwarder (Medical):    Lack of Transportation (Non-Medical):   Physical Activity:    Days of Exercise per Week:    Minutes of Exercise per Session:   Stress:    Feeling of Stress :   Social Connections:    Frequency of Communication with Friends and Family:    Frequency of Social Gatherings with Friends and Family:    Attends Religious Services:    Active Member of Clubs or Organizations:    Attends Engineer, structural:    Marital Status:   Intimate Partner Violence:    Fear of Current or Ex-Partner:    Emotionally Abused:    Physically Abused:    Sexually Abused:     Family History  Problem Relation Age of Onset   Diabetes Mother    Hypertension Mother     BP 118/80    Ht 5\' 4"  (1.626 m)    Wt 178 lb (80.7 kg)    BMI 30.55 kg/m   Review of Systems: See  HPI above.     Objective:  Physical Exam:  Gen: NAD, comfortable in exam room  Left shoulder: No swelling, ecchymoses.  No gross deformity. No TTP. FROM. Negative Hawkins, Neers. Negative Yergasons. Strength 5/5 with empty can and resisted internal/external rotation.  Mild pain ER. NV intact distally.   Assessment & Plan:  1. Left shoulder pain - mild improvement compared to last visit.  Pain primarily with external rotation now.  Radiographs were negative.  She would like to continue with home exercise program.  Add diclofenac twice a day if needed.  Consider physical therapy, injection, nitro patches if not improving.  F/u in 6 weeks.

## 2019-12-02 ENCOUNTER — Telehealth: Payer: Self-pay | Admitting: General Practice

## 2019-12-02 NOTE — Telephone Encounter (Signed)
Patient is requesting a note for work. She works at [employer] in and out. Can she get a note for work? She was seen here in July and is scheduled toward the end of the month I believe. Thanks

## 2019-12-03 ENCOUNTER — Telehealth: Payer: Self-pay | Admitting: General Practice

## 2019-12-03 ENCOUNTER — Encounter: Payer: Self-pay | Admitting: *Deleted

## 2019-12-03 NOTE — Telephone Encounter (Signed)
Patient called in stating she had a voicemail and is calling back.The voicemail was about a work for note I believe. Thanks.

## 2019-12-18 ENCOUNTER — Other Ambulatory Visit: Payer: Self-pay

## 2019-12-18 ENCOUNTER — Ambulatory Visit (INDEPENDENT_AMBULATORY_CARE_PROVIDER_SITE_OTHER): Payer: 59 | Admitting: Family Medicine

## 2019-12-18 VITALS — BP 96/68 | Ht 64.0 in | Wt 173.0 lb

## 2019-12-18 DIAGNOSIS — M25512 Pain in left shoulder: Secondary | ICD-10-CM | POA: Diagnosis not present

## 2019-12-18 DIAGNOSIS — G2589 Other specified extrapyramidal and movement disorders: Secondary | ICD-10-CM | POA: Diagnosis not present

## 2019-12-18 NOTE — Patient Instructions (Addendum)
Thank you for coming in to see [place] today! Please see below to review our plan for today's visit:   1.   Please plan to go to formal physical therapy for scapular dyskinesis and your rotator cuff. 2.   You may continue diclofenac or ibuprofen as needed for pain 3.   Please plan to follow-up with [place] in 6 weeks   Please call the clinic at [phone] if your symptoms worsen or you have any concerns. It was our pleasure to serve you.       Dr. Guy Sandifer Dr. Norton Blizzard  [facility]

## 2019-12-18 NOTE — Progress Notes (Signed)
PCP: Patient, No Pcp Per  Subjective:   HPI: Patient is a 34 y.o. female here for reevaluation of left shoulder pain.  She was last seen on 11/06/2019, was being treated conservatively with home exercise and anti-inflammatories.  She was having improvement however after she returned to work after that last visit, she has had worsening pain.  She reports that the pain is mostly over her lateral shoulder and extends up into her trapezius area.  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  She tried the oral diclofenac and did not find it very helpful so stopped taking it.  She also has some occasional numbness and tingling in her arm and hand in a nondermatomal pattern.  She also notes that she feels like there is a bump on her shoulder when she lays on her back.  No new trauma or injury.   Past Medical History:  Diagnosis Date  . Asthma     Current Outpatient Medications on File Prior to Visit  Medication Sig Dispense Refill  . diclofenac (VOLTAREN) 75 MG EC tablet Take 1 tablet (75 mg total) by mouth 2 (two) times daily. 60 tablet 1  . ferrous sulfate 325 (65 FE) MG tablet Take 1 tablet (325 mg total) by mouth daily. (Patient not taking: Reported on 03/27/2019) 30 tablet 0  . medroxyPROGESTERone (PROVERA) 10 MG tablet Take 2 tablets (20 mg total) by mouth daily. For 30 days 60 tablet 2  . naproxen (NAPROSYN) 500 MG tablet Take 1 tablet (500 mg total) by mouth 2 (two) times daily. 30 tablet 0   No current facility-administered medications on file prior to visit.    Past Surgical History:  Procedure Laterality Date  . SKIN GRAFT Right    right hand    No Known Allergies  Social History   Socioeconomic History  . Marital status: Single    Spouse name: Not on file  . Number of children: Not on file  . Years of education: Not on file  . Highest education level: Not on file  Occupational History  . Not on file  Tobacco Use   . Smoking status: Never Smoker  . Smokeless tobacco: Never Used  Substance and Sexual Activity  . Alcohol use: Yes  . Drug use: Yes    Types: Marijuana  . Sexual activity: Not on file  Other Topics Concern  . Not on file  Social History Narrative  . Not on file   Social Determinants of Health   Financial Resource Strain:   . Difficulty of Paying Living Expenses: Not on file  Food Insecurity:   . Worried About Programme researcher, broadcasting/film/video in the Last Year: Not on file  . Ran Out of Food in the Last Year: Not on file  Transportation Needs:   . Lack of Transportation (Medical): Not on file  . Lack of Transportation (Non-Medical): Not on file  Physical Activity:   . Days of Exercise per Week: Not on file  . Minutes of Exercise per Session: Not on file  Stress:   . Feeling of Stress : Not on file  Social Connections:   . Frequency of Communication with Friends and Family: Not on file  . Frequency of Social Gatherings with Friends and Family: Not on file  . Attends Religious Services: Not on file  . Active Member of Clubs or Organizations: Not on file  . Attends Banker Meetings: Not on file  .  Marital Status: Not on file  Intimate Partner Violence:   . Fear of Current or Ex-Partner: Not on file  . Emotionally Abused: Not on file  . Physically Abused: Not on file  . Sexually Abused: Not on file    Family History  Problem Relation Age of Onset  . Diabetes Mother   . Hypertension Mother     BP 96/68   Ht 5\' 4"  (1.626 m)   Wt 173 lb (78.5 kg)   BMI 29.70 kg/m   Review of Systems: See HPI above.     Objective:  Physical Exam:  Gen: NAD, comfortable in exam room  L Shoulder: -Inspection: no obvious deformity, atrophy, or asymmetry. No bruising. No swelling -Palpation: Nontender over bicipital groove.  Mild tenderness over AC joint.  Diffusely tender to palpation across trapezius and significant muscle tension over the trapezius. -ROM: Full ROM in abduction,  flexion, internal/external rotation both passively and actively , but with pain with internal rotation and end abduction NV intact distally Patient does have abnormal scapular function with winging of the superomedial aspect of the scapula compared to the right. Special Tests:  - Impingement:  Positive Hawkins, Neg neers, Neg empty can sign. - Supraspinatous:  5/5 strength with resisted flexion at 20 degrees - Infraspinatous/Teres Minor: 5/5 strength with ER - Subscapularis: 5/5 strength with IR - Biceps tendon: Neg Speeds - Labrum: Negative Obriens, good stability - No painful arc and no drop arm sign - Negative Spurling's    Assessment & Plan:  1.  Left rotator cuff impingement Patient with persistent symptoms, suspect that this is mostly due to scapular dyskinesis as noted below, however will continue her home exercises and refer to physical therapy for formal evaluation and perhaps manual therapy.  Diclofenac or ibuprofen as needed.  Follow-up 6 weeks.  2.  Left scapular dyskinesis Patient with findings consistent with scapular dyskinesis.  Suspect that this may be the main driver of her pain currently.  We will plan to refer to PT for for evaluation, manual therapy, and exercises as recommended by them.  Follow-up 6 weeks.

## 2019-12-19 ENCOUNTER — Encounter: Payer: Self-pay | Admitting: Family Medicine

## 2020-01-07 ENCOUNTER — Encounter: Payer: Self-pay | Admitting: Physical Therapy

## 2020-01-07 ENCOUNTER — Ambulatory Visit: Payer: 59 | Attending: Family Medicine | Admitting: Physical Therapy

## 2020-01-07 ENCOUNTER — Other Ambulatory Visit: Payer: Self-pay

## 2020-01-07 ENCOUNTER — Emergency Department (HOSPITAL_COMMUNITY)
Admission: EM | Admit: 2020-01-07 | Discharge: 2020-01-08 | Disposition: A | Discharge status: Home / Self Care | Payer: 59 | Attending: Emergency Medicine | Admitting: Emergency Medicine

## 2020-01-07 ENCOUNTER — Encounter (HOSPITAL_COMMUNITY): Payer: Self-pay | Admitting: Emergency Medicine

## 2020-01-07 DIAGNOSIS — R1110 Vomiting, unspecified: Secondary | ICD-10-CM | POA: Insufficient documentation

## 2020-01-07 DIAGNOSIS — M25512 Pain in left shoulder: Secondary | ICD-10-CM | POA: Insufficient documentation

## 2020-01-07 DIAGNOSIS — R0789 Other chest pain: Secondary | ICD-10-CM | POA: Insufficient documentation

## 2020-01-07 DIAGNOSIS — R0602 Shortness of breath: Secondary | ICD-10-CM | POA: Diagnosis not present

## 2020-01-07 DIAGNOSIS — Z5321 Procedure and treatment not carried out due to patient leaving prior to being seen by health care provider: Secondary | ICD-10-CM | POA: Diagnosis not present

## 2020-01-07 DIAGNOSIS — R61 Generalized hyperhidrosis: Secondary | ICD-10-CM | POA: Diagnosis not present

## 2020-01-07 MED ORDER — ONDANSETRON 4 MG PO TBDP
4.0000 mg | ORAL_TABLET | Freq: Once | ORAL | Status: AC
  Administered 2020-01-07: 4 mg via ORAL
  Filled 2020-01-07: qty 1

## 2020-01-07 NOTE — ED Triage Notes (Signed)
Patient reports central chest pain with SOB , emesis and diaphoresis this evening , no cough or fever .

## 2020-01-07 NOTE — Therapy (Signed)
[facility] [address] [place], [place], [address] Phone: [phone]   Fax:  [phone]  Physical Therapy Evaluation  Patient Details  Name: Donna Carroll MRN: [id number] Date of Birth: [birth year] Referring Provider (PT): Norton Blizzard, MD   Encounter Date: 01/07/2020   PT End of Session - 01/07/20 1508    Visit Number 1    Number of Visits 12    Date for PT Re-Evaluation 02/18/20    Authorization Type [employer], recheck FOTO status by visit 6    PT Start Time 1414    PT Stop Time 1459    PT Time Calculation (min) 45 min    Activity Tolerance Patient tolerated treatment well    Behavior During Therapy [facility] for tasks assessed/performed           Past Medical History:  Diagnosis Date  . Asthma     Past Surgical History:  Procedure Laterality Date  . SKIN GRAFT Right    right hand    There were no vitals filed for this visit.    Subjective Assessment - 01/07/20 1412    Subjective Pt. is a 34 y/o female referred to PT for persistent left shoulder pain following injury 07/19/19 in which she slipped while walking down [place] ramp-fell on hand and jammed shoulder "up". X-rays (-) for fracture. She continues to have pain in both anterior and posterior aspect of shoulder worse with lifting and activity.    Pertinent History skin graft surgery for right hand, asthma    Limitations Lifting;House hold activities    Diagnostic tests X-rays    Patient Stated Goals Get shoulder better/back to normal    Currently in Pain? Yes    Pain Score 5     Pain Location Shoulder    Pain Orientation Left    Pain Descriptors / Indicators Sore;Tightness    Pain Type Chronic pain   chronic since acute injury 07/19/19   Pain Onset More than a month ago    Pain Frequency Constant    Aggravating Factors  lifting, activity    Pain Relieving Factors better with rest than activity    Effect of Pain on Daily Activities limits ability for  lifting and reaching activities              [facility] PT Assessment - 01/07/20 0001      Assessment   Medical Diagnosis Acute left shoulder pain   with scapular dyskinesis and impingement   Referring Provider (PT) Norton Blizzard, MD    Onset Date/Surgical Date 07/19/19    Hand Dominance Left    Next MD Visit 02/03/2020    Prior Therapy none      Precautions   Precautions None      Restrictions   Weight Bearing Restrictions No      Balance Screen   Has the patient fallen in the past 6 months Yes    How many times? 1   with injury as noted in subjective     Prior Function   Level of Independence Independent with basic ADLs;Independent with community mobility without device    Vocation --   Conservation officer, nature at [employer]     Cognition   Overall Cognitive Status Within Functional Limits for tasks assessed      Observation/Other Assessments   Focus on Therapeutic Outcomes (FOTO)  49% limited      Sensation   Light Touch Appears Intact      Posture/Postural Control  Posture Comments rounded shoulders left>right with left scapula mildly elevated      ROM / Strength   AROM / PROM / Strength AROM;Strength      AROM   AROM Assessment Site Shoulder;Cervical    Right/Left Shoulder Right;Left    Right Shoulder Flexion 165 Degrees    Right Shoulder ABduction 180 Degrees    Right Shoulder Internal Rotation --   reach to T6   Right Shoulder External Rotation --   reach to T4   Left Shoulder Flexion 155 Degrees    Left Shoulder ABduction 165 Degrees    Left Shoulder Internal Rotation --   reach to T10   Left Shoulder External Rotation --   reach to T3   Cervical Flexion 57    Cervical Extension 39    Cervical - Right Side Bend 40    Cervical - Left Side Bend 40    Cervical - Right Rotation 62    Cervical - Left Rotation 60      Strength   Strength Assessment Site Shoulder;Elbow;Wrist    Right/Left Shoulder Right;Left    Right Shoulder Flexion 5/5    Right Shoulder  ABduction 5/5    Right Shoulder Internal Rotation 5/5    Right Shoulder External Rotation 5/5    Left Shoulder Flexion 4+/5    Left Shoulder ABduction 4/5    Left Shoulder Internal Rotation 5/5    Left Shoulder External Rotation 5/5    Right/Left Elbow Right;Left    Right Elbow Flexion 5/5    Right Elbow Extension 5/5    Left Elbow Flexion 5/5    Left Elbow Extension 5/5    Right/Left Wrist Right;Left    Right Wrist Flexion 5/5    Right Wrist Extension 5/5    Left Wrist Flexion 5/5    Left Wrist Extension 5/5      Palpation   Palpation comment tender to palpation left AC joint region, general muscle tenderness in deltoids, upper trapezius and posterior rotator cuff      Special Tests   Other special tests Active compression (+) for AC joint pain, Hawkin's and empty can (+), infraspinatus (-) for weakness                      Objective measurements completed on examination: See above findings.       [facility] Adult PT Treatment/Exercise - 01/07/20 0001      Exercises   Exercises --   HEP handout review                 PT Education - 01/07/20 1507    Education Details HEP, POC, eval findings with potential symptom etiology and shoulder anatomy review using shoulder model, FOTO patient report    Person(s) Educated Patient    Methods Explanation;Demonstration;Verbal cues;Handout    Comprehension Verbalized understanding               PT Long Term Goals - 01/07/20 1517      PT LONG TERM GOAL #1   Title Independent with HEP    Baseline needs HEP    Time 6    Period Weeks    Status New    Target Date 02/18/20      PT LONG TERM GOAL #2   Title Improve FOTO outcome measure to 29% or less impairment due to left shoulder    Baseline 49% limited    Time 6    Period Weeks  Status New    Target Date 02/18/20      PT LONG TERM GOAL #3   Title Increase left shoulder flexion and abduction strength to 5/5 to improve ability 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, nature    Baseline flexion 4+/5, abduction 4/5    Time 6    Period Weeks    Status New    Target Date 02/18/20      PT LONG TERM GOAL #4   Title Perform reaching ADLs and lifting for work as needed with left shoulder pain 2/10 or less at worst    Baseline 5/10 pain today, intermittently higher with activity    Time 6    Period Weeks    Status New    Target Date 02/18/20      PT LONG TERM GOAL #5   Title Demonstrate left shoulder flexion and abduction AROM with grossly symmetrical scapular mechanics to help decrease pain with reaching activities    Baseline scapular dyskinesis on left    Time 6    Period Weeks    Status New    Target Date 02/18/20                  Plan - 01/07/20 1508    Clinical Impression Statement Pt. presents with persistent left shoulder pain ongoing now nearly 6 months s/p fall injury. Eval findings today of left shoulder weakness mainly in flexion and abduction as well as decreased ROM vs. contralateral UE. Concern for intrinsic shoulder pathology or ligamentous injury given continued pain for timeframe since occurence. Findings also consistent with potential impingement along with scapular dyskinesis along with contributing myofascial pain to current symptoms. Plan trial of therapy to see if improvement can be obtained with conservative tx. efforts.    Personal Factors and Comorbidities Time since onset of injury/illness/exacerbation   mechanism of onset   Examination-Activity Limitations Carry;Lift;Reach Overhead;Hygiene/Grooming;Dressing;Bathing    Examination-Participation Restrictions Cleaning   lifting and reaching for work duties   Stability/Clinical Decision Making Evolving/Moderate complexity    Clinical Decision Making Moderate    Rehab Potential Fair    PT Frequency 2x / week    PT Duration 6 weeks    PT Treatment/Interventions ADLs/Self Care Home Management;Cryotherapy;Electrical Stimulation;Ultrasound;Iontophoresis 4mg /ml  Dexamethasone;Moist Heat;Therapeutic activities;Neuromuscular re-education;Therapeutic exercise;Patient/family education;Manual techniques;Dry needling;Taping    PT Next Visit Plan Review HEP as needed, continue/progress scapular and rotator cuff stabilization and strengthening as tolerated, consider ionto AC joint region, possible trial dry needling to left anterior and middle deltoid and potential posterior cuff, upper trap and pec    PT Home Exercise Plan Access code:    Consulted and Agree with Plan of Care Patient           Patient will benefit from skilled therapeutic intervention in order to improve the following deficits and impairments:  Pain, Postural dysfunction, Impaired UE functional use, Decreased strength, Decreased range of motion, Increased muscle spasms  Visit Diagnosis: Acute pain of left shoulder     Problem List Patient Active Problem List   Diagnosis Date Noted  . DUB (dysfunctional uterine bleeding) 03/27/2019    14/05/2018, PT, DPT 01/07/20 3:21 PM  [facility] [address] [place], [place], [place] Phone: [phone]   Fax:  [phone]  Name: ERINA HAMME MRN: Harlon Ditty Date of Birth: 1985-06-15

## 2020-01-08 ENCOUNTER — Emergency Department (HOSPITAL_COMMUNITY): Payer: 59

## 2020-01-08 LAB — BASIC METABOLIC PANEL
Anion gap: 10 (ref 5–15)
BUN: 10 mg/dL (ref 6–20)
CO2: 25 mmol/L (ref 22–32)
Calcium: 9 mg/dL (ref 8.9–10.3)
Chloride: 102 mmol/L (ref 98–111)
Creatinine, Ser: 0.85 mg/dL (ref 0.44–1.00)
GFR calc Af Amer: >60 mL/min (ref >60)
GFR calc non Af Amer: >60 mL/min (ref >60)
Glucose, Bld: 133 mg/dL — ABNORMAL HIGH (ref 70–99)
Potassium: 3.6 mmol/L (ref 3.5–5.1)
Sodium: 137 mmol/L (ref 135–145)

## 2020-01-08 LAB — CBC
HCT: 38.4 % (ref 36.0–46.0)
Hemoglobin: 12 g/dL (ref 12.0–15.0)
MCH: 25.5 pg — ABNORMAL LOW (ref 26.0–34.0)
MCHC: 31.3 g/dL (ref 30.0–36.0)
MCV: 81.5 fL (ref 80.0–100.0)
Platelets: 416 10*3/uL — ABNORMAL HIGH (ref 150–400)
RBC: 4.71 MIL/uL (ref 3.87–5.11)
RDW: 15.9 % — ABNORMAL HIGH (ref 11.5–15.5)
WBC: 8 10*3/uL (ref 4.0–10.5)
nRBC: 0 % (ref 0.0–0.2)

## 2020-01-08 LAB — I-STAT BETA HCG BLOOD, ED (MC, WL, AP ONLY): I-stat hCG, quantitative: <5 m[IU]/mL (ref <5)

## 2020-01-08 LAB — TROPONIN I (HIGH SENSITIVITY): Troponin I (High Sensitivity): 7 ng/L (ref <18)

## 2020-01-08 NOTE — ED Notes (Signed)
Pt notified staff that she was leaving 

## 2020-01-17 ENCOUNTER — Telehealth: Payer: Self-pay | Admitting: Physical Therapy

## 2020-01-17 ENCOUNTER — Ambulatory Visit: Payer: 59 | Admitting: Physical Therapy

## 2020-01-17 NOTE — Telephone Encounter (Signed)
Called and spoke with patient regarding no show for therapy appointment, she reports was unable to attend due to work. She will call back to reschedule/change appointments as needed.

## 2020-01-20 ENCOUNTER — Encounter: Payer: 59 | Admitting: Physical Therapy

## 2020-01-23 ENCOUNTER — Ambulatory Visit: Payer: 59 | Admitting: Physical Therapy

## 2020-01-27 ENCOUNTER — Encounter: Payer: 59 | Admitting: Physical Therapy

## 2020-01-29 ENCOUNTER — Ambulatory Visit (HOSPITAL_COMMUNITY)
Admission: EM | Admit: 2020-01-29 | Discharge: 2020-01-29 | Disposition: A | Discharge status: Home / Self Care | Payer: 59 | Attending: Internal Medicine | Admitting: Internal Medicine

## 2020-01-29 ENCOUNTER — Encounter (HOSPITAL_COMMUNITY): Payer: Self-pay | Admitting: Emergency Medicine

## 2020-01-29 ENCOUNTER — Other Ambulatory Visit: Payer: Self-pay

## 2020-01-29 DIAGNOSIS — B354 Tinea corporis: Secondary | ICD-10-CM

## 2020-01-29 MED ORDER — GRISEOFULVIN ULTRAMICROSIZE 125 MG PO TABS: 375.0000 mg | ORAL_TABLET | Freq: Every day | ORAL | 0 refills | Status: DC

## 2020-01-29 MED ORDER — HYDROXYZINE HCL 25 MG PO TABS: 25.0000 mg | ORAL_TABLET | Freq: Four times a day (QID) | ORAL | 0 refills | Status: DC

## 2020-01-29 NOTE — ED Triage Notes (Signed)
Pt presents with rash on right arm and under left eye. States itchy and painful.

## 2020-01-29 NOTE — ED Provider Notes (Signed)
[facility]    CSN: [id number] Arrival date & time: 01/29/20  1558      History   Chief Complaint Chief Complaint  Patient presents with  . Rash    HPI Donna Carroll is a 33 y.o. female with a history of asthma comes to the urgent care with complaints of itchy rash on the right forearm and under her left eye.  Symptoms started several days ago and has been worsening.  No redness.  No discharge.  No pain in the area of the rash.  Patient also complains of worsening shortness of breath and wheezing whilst at work.  She has a history of asthma and uses albuterol inhaler on a regular basis.  Her breathing problems are worse at work where she is exposed to cold temperatures.  No chest pain or chest pressure.  No cough or sputum production.  No fever or chills.Marland Kitchen   HPI  Past Medical History:  Diagnosis Date  . Asthma     Patient Active Problem List   Diagnosis Date Noted  . DUB (dysfunctional uterine bleeding) 03/27/2019    Past Surgical History:  Procedure Laterality Date  . SKIN GRAFT Right    right hand    OB History    Gravida  0   Para  0   Term  0   Preterm  0   AB  0   Living  0     SAB  0   TAB  0   Ectopic  0   Multiple  0   Live Births  0            Home Medications    Prior to Admission medications   Medication Sig Start Date End Date Taking? Authorizing Provider  diclofenac (VOLTAREN) 75 MG EC tablet Take 1 tablet (75 mg total) by mouth 2 (two) times daily. 11/06/19   Hudnall, Azucena Fallen, MD  griseofulvin (GRIS-PEG) 125 MG tablet Take 3 tablets (375 mg total) by mouth daily for 10 days. 01/29/20 02/08/20  Merrilee Jansky, MD  hydrOXYzine (ATARAX/VISTARIL) 25 MG tablet Take 1 tablet (25 mg total) by mouth every 6 (six) hours. 01/29/20   Arran Fessel, Britta Mccreedy, MD  ferrous sulfate 325 (65 FE) MG tablet Take 1 tablet (325 mg total) by mouth daily. Patient not taking: Reported on 03/27/2019 03/08/19 01/29/20  Georgetta Haber, NP    medroxyPROGESTERone (PROVERA) 10 MG tablet Take 2 tablets (20 mg total) by mouth daily. For 30 days Patient not taking: Reported on 01/07/2020 03/27/19 01/29/20  Adam Phenix, MD    Family History Family History  Problem Relation Age of Onset  . Diabetes Mother   . Hypertension Mother     Social History Social History   Tobacco Use  . Smoking status: Never Smoker  . Smokeless tobacco: Never Used  Substance Use Topics  . Alcohol use: Yes  . Drug use: Yes    Types: Marijuana     Allergies   Patient has no known allergies.   Review of Systems Review of Systems  Respiratory: Positive for shortness of breath and wheezing.   Cardiovascular: Negative for chest pain and palpitations.  Skin: Positive for rash. Negative for color change.     Physical Exam Triage Vital Signs ED Triage Vitals  Enc Vitals Group     BP 01/29/20 1821 109/77     Pulse Rate 01/29/20 1821 (!) 59     Resp 01/29/20 1821 16  Temp 01/29/20 1821 98.8 F (37.1 C)     Temp Source 01/29/20 1821 Oral     SpO2 01/29/20 1821 100 %     Weight --      Height --      Head Circumference --      Peak Flow --      Pain Score 01/29/20 1820 3     Pain Loc --      Pain Edu? --      Excl. in GC? --    No data found.  Updated Vital Signs BP 109/77 (BP Location: Left Arm)   Pulse (!) 59   Temp 98.8 F (37.1 C) (Oral)   Resp 16   LMP 12/30/2019   SpO2 100%   Visual Acuity Right Eye Distance:   Left Eye Distance:   Bilateral Distance:    Right Eye Near:   Left Eye Near:    Bilateral Near:     Physical Exam Vitals and nursing note reviewed.  Constitutional:      General: She is not in acute distress.    Appearance: She is not ill-appearing.  Cardiovascular:     Rate and Rhythm: Normal rate and regular rhythm.     Pulses: Normal pulses.     Heart sounds: Normal heart sounds.  Musculoskeletal:        General: Normal range of motion.  Skin:    General: Skin is warm.     Comments: Fungal  rash in the right antecubital area and on the left lower eyelid.  Neurological:     Mental Status: She is alert.      UC Treatments / Results  Labs (all labs ordered are listed, but only abnormal results are displayed) Labs Reviewed - No data to display  EKG   Radiology No results found.  Procedures Procedures (including critical care time)  Medications Ordered in UC Medications - No data to display  Initial Impression / Assessment and Plan / UC Course  I have reviewed the triage vital signs and the nursing notes.  Pertinent labs & imaging results that were available during my care of the patient were reviewed by me and considered in my medical decision making (see chart for details).     1.  Tinea corporis: Griseofulvin 375 mg orally daily for 7 days Hydroxyzine as needed for itching Return precautions given  Final Clinical Impressions(s) / UC Diagnoses   Final diagnoses:  Tinea corporis   Discharge Instructions   None    ED Prescriptions    Medication Sig Dispense Auth. Provider   griseofulvin (GRIS-PEG) 125 MG tablet Take 3 tablets (375 mg total) by mouth daily for 10 days. 30 tablet Tarl Cephas, Britta Mccreedy, MD   hydrOXYzine (ATARAX/VISTARIL) 25 MG tablet Take 1 tablet (25 mg total) by mouth every 6 (six) hours. 12 tablet Natalina Wieting, Britta Mccreedy, MD     PDMP not reviewed this encounter.   Merrilee Jansky, MD 01/29/20 [phone]

## 2020-01-30 ENCOUNTER — Encounter: Payer: 59 | Admitting: Physical Therapy

## 2020-01-31 ENCOUNTER — Ambulatory Visit: Payer: 59 | Admitting: Obstetrics & Gynecology

## 2020-01-31 ENCOUNTER — Encounter: Payer: Self-pay | Admitting: Obstetrics & Gynecology

## 2020-01-31 ENCOUNTER — Telehealth (HOSPITAL_COMMUNITY): Payer: Self-pay

## 2020-01-31 ENCOUNTER — Other Ambulatory Visit (HOSPITAL_COMMUNITY)
Admission: RE | Admit: 2020-01-31 | Discharge: 2020-01-31 | Disposition: A | Discharge status: Home / Self Care | Payer: 59 | Source: Ambulatory Visit | Attending: Obstetrics & Gynecology | Admitting: Obstetrics & Gynecology

## 2020-01-31 ENCOUNTER — Other Ambulatory Visit: Payer: Self-pay

## 2020-01-31 VITALS — BP 125/88 | HR 69 | Temp 98.5°F | Ht 64.0 in | Wt 181.8 lb

## 2020-01-31 DIAGNOSIS — L680 Hirsutism: Secondary | ICD-10-CM

## 2020-01-31 DIAGNOSIS — N760 Acute vaginitis: Secondary | ICD-10-CM | POA: Diagnosis not present

## 2020-01-31 DIAGNOSIS — N939 Abnormal uterine and vaginal bleeding, unspecified: Secondary | ICD-10-CM | POA: Diagnosis not present

## 2020-01-31 DIAGNOSIS — B9689 Other specified bacterial agents as the cause of diseases classified elsewhere: Secondary | ICD-10-CM

## 2020-01-31 MED ORDER — DROSPIRENONE-ETHINYL ESTRADIOL 3-0.03 MG PO TABS: 1.0000 | ORAL_TABLET | Freq: Every day | ORAL | 2 refills | Status: DC

## 2020-01-31 NOTE — Telephone Encounter (Signed)
Pt called to inform us that pharmacy never received her medication that was prescribed on 01/29/20. Staff called pharmacy on file and give a verbal to the pharmacist

## 2020-01-31 NOTE — Patient Instructions (Addendum)
Abnormal Uterine Bleeding Abnormal uterine bleeding is unusual bleeding from the uterus. It includes:  Bleeding or spotting between periods.  Bleeding after sex.  Bleeding that is heavier than normal.  Periods that last longer than usual.  Bleeding after menopause. Abnormal uterine bleeding can affect women at various stages in life, including teenagers, women in their reproductive years, pregnant women, and women who have reached menopause. Common causes of abnormal uterine bleeding include:  Pregnancy.  Growths of tissue (polyps).  A noncancerous tumor in the uterus (fibroid).  Infection.  Cancer.  Hormonal imbalances. Any type of abnormal bleeding should be evaluated by a health care provider. Many cases are minor and simple to treat, while others are more serious. Treatment will depend on the cause of the bleeding. Follow these instructions at home:  Monitor your condition for any changes.  Do not use tampons, douche, or have sex if told by your health care provider.  Change your pads often.  Get regular exams that include pelvic exams and cervical cancer screening.  Keep all follow-up visits as told by your health care provider. This is important. Contact a health care provider if:  Your bleeding lasts for more than one week.  You feel dizzy at times.  You feel nauseous or you vomit. Get help right away if:  You pass out.  Your bleeding soaks through a pad every hour.  You have abdominal pain.  You have a fever.  You become sweaty or weak.  You pass large blood clots from your vagina. Summary  Abnormal uterine bleeding is unusual bleeding from the uterus.  Any type of abnormal bleeding should be evaluated by a health care provider. Many cases are minor and simple to treat, while others are more serious.  Treatment will depend on the cause of the bleeding. This information is not intended to replace advice given to you by your health care provider.  Make sure you discuss any questions you have with your health care provider. Document Revised: 07/19/2017 Document Reviewed: 05/13/2016 Elsevier Patient Education  2020 Elsevier Inc[employer]   Endometrial Ablation Endometrial ablation is a procedure that destroys the thin inner layer of the lining of the uterus (endometrium). This procedure may be done:  To stop heavy periods.  To stop bleeding that is causing anemia.  To control irregular bleeding.  To treat bleeding caused by small tumors (fibroids) in the endometrium. This procedure is often an alternative to major surgery, such as removal of the uterus and cervix (hysterectomy). As a result of this procedure:  You may not be able to have children. However, if you are premenopausal (you have not gone through menopause): ? You may still have a small chance of getting pregnant. ? You will need to use a reliable method of birth control after the procedure to prevent pregnancy.  You may stop having a menstrual period, or you may have only a small amount of bleeding during your period. Menstruation may return several years after the procedure. Tell a health care provider about:  Any allergies you have.  All medicines you are taking, including vitamins, herbs, eye drops, creams, and over-the-counter medicines.  Any problems you or family members have had with the use of anesthetic medicines.  Any blood disorders you have.  Any surgeries you have had.  Any medical conditions you have. What are the risks? Generally, this is a safe procedure. However, problems may occur, including:  A hole (perforation) in the uterus or bowel.  Infection of the  uterus, bladder, or vagina.  Bleeding.  Damage to other structures or organs.  An air bubble in the lung (air embolus).  Problems with pregnancy after the procedure.  Failure of the procedure.  Decreased ability to diagnose cancer in the endometrium. What happens before the  procedure?  You will have tests of your endometrium to make sure there are no pre-cancerous cells or cancer cells present.  You may have an ultrasound of the uterus.  You may be given medicines to thin the endometrium.  Ask your health care provider about: ? Changing or stopping your regular medicines. This is especially important if you take diabetes medicines or blood thinners. ? Taking medicines such as aspirin and ibuprofen. These medicines can thin your blood. Do not take these medicines before your procedure if your doctor tells you not to.  Plan to have someone take you home from the hospital or clinic. What happens during the procedure?   You will lie on an exam table with your feet and legs supported as in a pelvic exam.  To lower your risk of infection: ? Your health care team will wash or sanitize their hands and put on germ-free (sterile) gloves. ? Your genital area will be washed with soap.  An IV tube will be inserted into one of your veins.  You will be given a medicine to help you relax (sedative).  A surgical instrument with a light and camera (resectoscope) will be inserted into your vagina and moved into your uterus. This allows your surgeon to see inside your uterus.  Endometrial tissue will be removed using one of the following methods: ? Radiofrequency. This method uses a radiofrequency-alternating electric current to remove the endometrium. ? Cryotherapy. This method uses extreme cold to freeze the endometrium. ? Heated-free liquid. This method uses a heated saltwater (saline) solution to remove the endometrium. ? Microwave. This method uses high-energy microwaves to heat up the endometrium and remove it. ? Thermal balloon. This method involves inserting a catheter with a balloon tip into the uterus. The balloon tip is filled with heated fluid to remove the endometrium. The procedure may vary among health care providers and hospitals. What happens after the  procedure?  Your blood pressure, heart rate, breathing rate, and blood oxygen level will be monitored until the medicines you were given have worn off.  As tissue healing occurs, you may notice vaginal bleeding for 4-6 weeks after the procedure. You may also experience: ? Cramps. ? Thin, watery vaginal discharge that is light pink or brown in color. ? A need to urinate more frequently than usual. ? Nausea.  Do not drive for 24 hours if you were given a sedative.  Do not have sex or insert anything into your vagina until your health care provider approves. Summary  Endometrial ablation is done to treat the many causes of heavy menstrual bleeding.  The procedure may be done only after medications have been tried to control the bleeding.  Plan to have someone take you home from the hospital or clinic. This information is not intended to replace advice given to you by your health care provider. Make sure you discuss any questions you have with your health care provider. Document Revised: 09/26/2017 Document Reviewed: 04/28/2016 Elsevier Patient Education  [employer].   Hysteroscopy Hysteroscopy is a procedure that is used to examine the inside of a woman's womb (uterus). This may be done for various reasons, including:  To look for lumps (tumors) and other growths  in the uterus.  To evaluate abnormal bleeding, fibroid tumors, polyps, scar tissue (adhesions), or cancer of the uterus.  To determine the cause of an inability to get pregnant (infertility) or repeated losses of pregnancies (miscarriages).  To find a lost IUD (intrauterine device).  To perform a procedure that permanently prevents pregnancy (sterilization). During this procedure, a thin, flexible tube with a small light and camera (hysteroscope) is used to examine the uterus. The camera sends images to a monitor in the room so that your health care provider can view the inside of your uterus. A hysteroscopy should  be done right after a menstrual period to make sure that you are not pregnant. Tell a health care provider about:  Any allergies you have.  All medicines you are taking, including vitamins, herbs, eye drops, creams, and over-the-counter medicines.  Any problems you or family members have had with the use of anesthetic medicines.  Any blood disorders you have.  Any surgeries you have had.  Any medical conditions you have.  Whether you are pregnant or may be pregnant. What are the risks? Generally, this is a safe procedure. However, problems may occur, including:  Excessive bleeding.  Infection.  Damage to the uterus or other structures or organs.  Allergic reaction to medicines or fluids that are used in the procedure. What happens before the procedure? Staying hydrated Follow instructions from your health care provider about hydration, which may include:  Up to 2 hours before the procedure - you may continue to drink clear liquids, such as water, clear fruit juice, black coffee, and plain tea. Eating and drinking restrictions Follow instructions from your health care provider about eating and drinking, which may include:  8 hours before the procedure - stop eating solid foods and drink clear liquids only  2 hours before the procedure - stop drinking clear liquids. General instructions  Ask your health care provider about: ? Changing or stopping your normal medicines. This is important if you take diabetes medicines or blood thinners. ? Taking medicines such as aspirin and ibuprofen. These medicines can thin your blood and cause bleeding. Do not take these medicines for 1 week before your procedure, or as told by your health care provider.  Do not use any products that contain nicotine or tobacco for 2 weeks before the procedure. This includes cigarettes and e-cigarettes. If you need help quitting, ask your health care provider.  Medicine may be placed in your cervix the  day before the procedure. This medicine causes the cervix to have a larger opening (dilate). The larger opening makes it easier for the hysteroscope to be inserted into the uterus during the procedure.  Plan to have someone with you for the first 24-48 hours after the procedure, especially if you are given a medicine to make you fall asleep (general anesthetic).  Plan to have someone take you home from the hospital or clinic. What happens during the procedure?  To lower your risk of infection: ? Your health care team will wash or sanitize their hands. ? Your skin will be washed with soap. ? Hair may be removed from the surgical area.  An IV tube will be inserted into one of your veins.  You may be given one or more of the following: ? A medicine to help you relax (sedative). ? A medicine that numbs the area around the cervix (local anesthetic). ? A medicine to make you fall asleep (general anesthetic).  A hysteroscope will be inserted through your  vagina and into your uterus.  Air or fluid will be used to enlarge your uterus, enabling your health care provider to see your uterus better. The amount of fluid used will be carefully checked throughout the procedure.  In some cases, tissue may be gently scraped from inside the uterus and sent to a lab for testing (biopsy). The procedure may vary among health care providers and hospitals. What happens after the procedure?  Your blood pressure, heart rate, breathing rate, and blood oxygen level will be monitored until the medicines you were given have worn off.  You may have some cramping. You may be given medicines for this.  You may have bleeding, which varies from light spotting to menstrual-like bleeding. This is normal.  If you had a biopsy done, it is your responsibility to get the results of your procedure. Ask your health care provider, or the department performing the procedure, when your results will be  ready. Summary  Hysteroscopy is a procedure that is used to examine the inside of a woman's womb (uterus).  After the procedure, you may have bleeding, which varies from light spotting to menstrual-like bleeding. This is normal. You may also have cramping.  Plan to have someone take you home from the hospital or clinic. This information is not intended to replace advice given to you by your health care provider. Make sure you discuss any questions you have with your health care provider. Document Revised: 03/24/2017 Document Reviewed: 05/10/2016 Elsevier Patient Education  2020 [employer]

## 2020-01-31 NOTE — Progress Notes (Signed)
GYNECOLOGY OFFICE VISIT NOTE  History:   Donna Carroll is a 34 y.o. G0 here today for report of abnormal uterine bleeding (AUB) for several months.  Had similar complaint in 2020, had ultrasound in 03/2019 that showed possible 5 mm polyp; did not follow up afterwards given lack of insurance. She also reported bleeding improved but worsened again recently. Reports using 1 pad/hr during heavy bleeding times, but currently has light bleeding.  Feels tired and lightheaded occasionally.  Also concerned about increased hair growth on chin and face.  She denies any abnormal vaginal discharge, bleeding, pelvic pain or other concerns. Has not been sexually active in five years.    Past Medical History:  Diagnosis Date  . Asthma   . Irregular intermenstrual bleeding     Past Surgical History:  Procedure Laterality Date  . SKIN GRAFT Right    right hand    The following portions of the patient's history were reviewed and updated as appropriate: allergies, current medications, past family history, past medical history, past social history, past surgical history and problem list.   Health Maintenance:  Normal pap several years ago.   Review of Systems:  Pertinent items noted in HPI and remainder of comprehensive ROS otherwise negative.  Physical Exam:  BP 125/88 (BP Location: Right Arm, Patient Position: Sitting, Cuff Size: Large)   Pulse 69   Temp 98.5 F (36.9 C) (Oral)   Ht 5\' 4"  (1.626 m)   Wt 181 lb 12.8 oz (82.5 kg)   BMI 31.21 kg/m  CONSTITUTIONAL: Well-developed, well-nourished female in no acute distress.  HEENT:  Normocephalic, atraumatic. External right and left ear normal. No scleral icterus.  NECK: Normal range of motion, supple, no masses noted on observation SKIN: No rash noted. Not diaphoretic. No erythema. No pallor. MUSCULOSKELETAL: Normal range of motion. No edema noted. NEUROLOGIC: Alert and oriented to person, place, and time. Normal muscle tone coordination. No  cranial nerve deficit noted. PSYCHIATRIC: Normal mood and affect. Normal behavior. Normal judgment and thought content. CARDIOVASCULAR: Normal heart rate noted RESPIRATORY: Effort and breath sounds normal, no problems with respiration noted ABDOMEN: No masses noted. Nontender. No other overt distention noted.   PELVIC: Normal appearing external genitalia; normal urethral meatus; normal appearing vaginal mucosa and cervix.  Scant bloody discharge noted. Pap and vaginal discharge testing samples obtained.  Normal uterine size, no other palpable masses, no uterine or adnexal tenderness. Performed in the presence of a chaperone     Assessment and Plan:      1. Abnormal uterine bleeding (AUB) 2. Hirsutism Likely anovulatory bleeding, polyp could also be contributing to it.  Will get another ultrasound to evaluate for change in last 10 months.  Labs checked.  Discussed management options for abnormal uterine bleeding including oral combined estrogen-progesterone pills (could also help with hirsutism), oral progesterone, Depo Provera, Levonogestrel IUD, polypectomy & endometrial ablation or hysterectomy as definitive surgical management.  Discussed risks and benefits of each method.   Patient desires trial of OCPs for now, but may want LARC later. May also want surgical intervention if polyp if still present.  Printed patient education handouts were given to the patient to review at home.  OCPs prescribed for now,  bleeding precautions reviewed.   - CBC - Ferritin - drospirenone-ethinyl estradiol (YASMIN 28) 3-0.03 MG tablet; Take 1 tablet by mouth daily.  Dispense: 28 tablet; Refill: 2 - TSH+Prl+TestT+TestF+17OHP - Cervicovaginal ancillary only( [place]) - Cytology - PAP - 09-29-1993 PELVIC COMPLETE WITH TRANSVAGINAL;  Future  Routine preventative health maintenance measures emphasized. Please refer to After Visit Summary for other counseling recommendations.   Return in about 4 weeks (around 02/28/2020)  for Followup.    Total face-to-face time with patient: 30 minutes.  Over 50% of encounter was spent on counseling and coordination of care.   Jaynie Collins, MD, FACOG Obstetrician & Gynecologist, [facility] for [employer], [facility]

## 2020-02-02 LAB — CERVICOVAGINAL ANCILLARY ONLY
Bacterial Vaginitis (gardnerella): POSITIVE — AB
Candida Glabrata: NEGATIVE
Candida Vaginitis: NEGATIVE
Chlamydia: NEGATIVE
Comment: NEGATIVE
Comment: NEGATIVE
Comment: NEGATIVE
Comment: NEGATIVE
Comment: NEGATIVE
Comment: NORMAL
Neisseria Gonorrhea: NEGATIVE
Trichomonas: NEGATIVE

## 2020-02-02 MED ORDER — TINIDAZOLE 500 MG PO TABS: 2.0000 g | ORAL_TABLET | Freq: Every day | ORAL | 2 refills | Status: AC

## 2020-02-02 NOTE — Addendum Note (Signed)
Addended by: Jaynie Collins A on: 02/02/2020 08:56 PM   Modules accepted: Orders

## 2020-02-03 ENCOUNTER — Ambulatory Visit: Payer: 59 | Admitting: Family Medicine

## 2020-02-03 LAB — CYTOLOGY - PAP
Comment: NEGATIVE
Diagnosis: NEGATIVE
High risk HPV: NEGATIVE

## 2020-02-04 ENCOUNTER — Encounter: Payer: 59 | Admitting: Physical Therapy

## 2020-02-06 ENCOUNTER — Encounter: Payer: 59 | Admitting: Physical Therapy

## 2020-02-07 LAB — CBC
Hematocrit: 39.4 % (ref 34.0–46.6)
Hemoglobin: 12.7 g/dL (ref 11.1–15.9)
MCH: 26 pg — ABNORMAL LOW (ref 26.6–33.0)
MCHC: 32.2 g/dL (ref 31.5–35.7)
MCV: 81 fL (ref 79–97)
Platelets: 452 10*3/uL — ABNORMAL HIGH (ref 150–450)
RBC: 4.89 x10E6/uL (ref 3.77–5.28)
RDW: 15.1 % (ref 11.7–15.4)
WBC: 5.3 10*3/uL (ref 3.4–10.8)

## 2020-02-07 LAB — FERRITIN: Ferritin: 22 ng/mL (ref 15–150)

## 2020-02-07 LAB — TSH+PRL+TESTT+TESTF+17OHP
17-Hydroxyprogesterone: 325 ng/dL
Prolactin: 15 ng/mL (ref 4.8–23.3)
TSH: 1.55 u[IU]/mL (ref 0.450–4.500)
Testosterone, Free: 3.9 pg/mL (ref 0.0–4.2)
Testosterone, Total, LC/MS: 101.3 ng/dL — ABNORMAL HIGH (ref 10.0–55.0)

## 2020-02-12 ENCOUNTER — Inpatient Hospital Stay: Admission: RE | Admit: 2020-02-12 | Payer: 59 | Source: Ambulatory Visit

## 2020-02-28 ENCOUNTER — Ambulatory Visit: Payer: 59

## 2020-04-14 NOTE — Therapy (Signed)
[place], [place], [address] Phone: [phone]   Fax:  [phone]  Physical Therapy Evaluation/Discharge  Patient Details  Name: Donna Carroll MRN: [id number] Date of Birth: Oct 15, 1985 Referring Provider (PT): Karlton Lemon, MD   Encounter Date: 01/07/2020    Past Medical History:  Diagnosis Date   Asthma    Irregular intermenstrual bleeding     Past Surgical History:  Procedure Laterality Date   SKIN GRAFT Right    right hand    There were no vitals filed for this visit.                    Objective measurements completed on examination: See above findings.                    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, water quality    Baseline flexion 4+/5, abduction 4/5    Time 6    Period Weeks    Status New    Target Date 02/18/20      PT LONG TERM GOAL #4   Title Perform reaching ADLs and lifting for work as needed with left shoulder pain 2/10 or less at worst    Baseline 5/10 pain today, intermittently higher with activity    Time 6    Period Weeks    Status New    Target Date 02/18/20      PT LONG TERM GOAL #5   Title Demonstrate left shoulder flexion and abduction AROM with grossly symmetrical scapular mechanics to help decrease pain with reaching activities    Baseline scapular dyskinesis on left    Time 6    Period Weeks    Status New    Target Date 02/18/20                    Patient will benefit from skilled therapeutic intervention in order to improve the following deficits and impairments:  Pain,Postural dysfunction,Impaired UE functional use,Decreased strength,Decreased range of motion,Increased muscle spasms  Visit Diagnosis: Acute pain of left shoulder - Plan: PT plan of care cert/re-cert     Problem List Patient Active Problem List   Diagnosis Date Noted   Abnormal uterine bleeding (AUB) 03/27/2019       PHYSICAL THERAPY DISCHARGE SUMMARY  Visits from Start of Care: 1  Current functional level related to goals / functional outcomes: Patient did not return for further therapy after evaluation visit.   Remaining deficits: Current status unknown   Education / Equipment: HEP Plan: Patient agrees to discharge.  Patient goals were not met. Patient is being discharged due to not returning since the last visit.  ?????  Beaulah Dinning, PT, DPT 04/14/20 10:37 AM     [facility] [address] [place], [place], [address] Phone: [phone]   Fax:  [phone]  Name: Donna Carroll MRN: [id number] Date of Birth: 1985/12/11

## 2020-05-04 ENCOUNTER — Other Ambulatory Visit: Payer: Self-pay | Admitting: Family Medicine

## 2020-05-15 ENCOUNTER — Telehealth: Payer: Self-pay | Admitting: *Deleted

## 2020-05-15 NOTE — Telephone Encounter (Signed)
Will email patient the return to work note

## 2020-07-05 ENCOUNTER — Other Ambulatory Visit: Payer: Self-pay

## 2020-07-05 ENCOUNTER — Encounter (HOSPITAL_COMMUNITY): Payer: Self-pay

## 2020-07-05 ENCOUNTER — Ambulatory Visit (HOSPITAL_COMMUNITY)
Admission: EM | Admit: 2020-07-05 | Discharge: 2020-07-05 | Disposition: A | Discharge status: Home / Self Care | Payer: 59 | Attending: Emergency Medicine | Admitting: Emergency Medicine

## 2020-07-05 DIAGNOSIS — R200 Anesthesia of skin: Secondary | ICD-10-CM | POA: Diagnosis not present

## 2020-07-05 DIAGNOSIS — K029 Dental caries, unspecified: Secondary | ICD-10-CM | POA: Diagnosis not present

## 2020-07-05 DIAGNOSIS — K047 Periapical abscess without sinus: Secondary | ICD-10-CM | POA: Diagnosis not present

## 2020-07-05 MED ORDER — AMOXICILLIN 875 MG PO TABS: 875.0000 mg | ORAL_TABLET | Freq: Two times a day (BID) | ORAL | 0 refills | Status: AC

## 2020-07-05 MED ORDER — IBUPROFEN 600 MG PO TABS: 600.0000 mg | ORAL_TABLET | Freq: Four times a day (QID) | ORAL | 0 refills | Status: AC | PRN

## 2020-07-05 NOTE — ED Provider Notes (Signed)
[facility]    CSN: [id number] Arrival date & time: 07/05/20  1054      History   Chief Complaint Chief Complaint  Patient presents with  . Dental Pain  . Otalgia         HPI Donna Carroll is a 35 y.o. female.   Patient presents with right lower tooth ache x 1-2 week.  The pain has progressed to her right ear.  She also has swelling and numbness in her right lower jaw.  She has not seen a dentist for her symptoms.  Patient also reports intermittent numbness in her left arm x 2 weeks; no numbness currently. She denies weakness, paresthesias, chest pain, shortness of breath, difficulty swallowing, or other symptoms.  She injured her shoulder 1 year ago and was followed by sports medicine.  For her medical history includes asthma and abnormal uterine bleeding.  The history is provided by the patient and medical records.    Past Medical History:  Diagnosis Date  . Asthma   . Irregular intermenstrual bleeding     Patient Active Problem List   Diagnosis Date Noted  . Abnormal uterine bleeding (AUB) 03/27/2019    Past Surgical History:  Procedure Laterality Date  . SKIN GRAFT Right    right hand    OB History    Gravida  0   Para  0   Term  0   Preterm  0   AB  0   Living  0     SAB  0   IAB  0   Ectopic  0   Multiple  0   Live Births  0            Home Medications    Prior to Admission medications   Medication Sig Start Date End Date Taking? Authorizing Provider  amoxicillin (AMOXIL) 875 MG tablet Take 1 tablet (875 mg total) by mouth 2 (two) times daily for 7 days. 07/05/20 07/12/20 Yes Mickie Bail, NP  ibuprofen (ADVIL) 600 MG tablet Take 1 tablet (600 mg total) by mouth every 6 (six) hours as needed. 07/05/20  Yes Mickie Bail, NP  drospirenone-ethinyl estradiol (YASMIN 28) 3-0.03 MG tablet Take 1 tablet by mouth daily. 01/31/20   Anyanwu, Jethro Bastos, MD  tinidazole (TINDAMAX) 500 MG tablet Take 4 tablets (2,000 mg total) by mouth  daily with breakfast. For two days 02/02/20   Anyanwu, Jethro Bastos, MD  ferrous sulfate 325 (65 FE) MG tablet Take 1 tablet (325 mg total) by mouth daily. Patient not taking: Reported on 03/27/2019 03/08/19 01/29/20  Georgetta Haber, NP  medroxyPROGESTERone (PROVERA) 10 MG tablet Take 2 tablets (20 mg total) by mouth daily. For 30 days Patient not taking: Reported on 01/07/2020 03/27/19 01/29/20  Adam Phenix, MD    Family History Family History  Problem Relation Age of Onset  . Diabetes Mother   . Hypertension Mother     Social History Social History   Tobacco Use  . Smoking status: Never Smoker  . Smokeless tobacco: Never Used  Vaping Use  . Vaping Use: Never used  Substance Use Topics  . Alcohol use: Not Currently  . Drug use: Not Currently    Types: Marijuana     Allergies   Patient has no known allergies.   Review of Systems Review of Systems  Constitutional: Negative for chills and fever.  HENT: Positive for dental problem and ear pain. Negative for sore throat.  Eyes: Negative for pain and visual disturbance.  Respiratory: Negative for cough and shortness of breath.   Cardiovascular: Negative for chest pain and palpitations.  Gastrointestinal: Negative for abdominal pain and vomiting.  Genitourinary: Negative for dysuria and hematuria.  Musculoskeletal: Negative for arthralgias and back pain.  Skin: Negative for color change and rash.  Neurological: Positive for numbness. Negative for dizziness, seizures, syncope, speech difficulty, weakness and headaches.  All other systems reviewed and are negative.    Physical Exam Triage Vital Signs ED Triage Vitals  Enc Vitals Group     BP      Pulse      Resp      Temp      Temp src      SpO2      Weight      Height      Head Circumference      Peak Flow      Pain Score      Pain Loc      Pain Edu?      Excl. in GC?    No data found.  Updated Vital Signs BP (!) 143/73 (BP Location: Left Arm)   Pulse 63    Temp 98.4 F (36.9 C) (Oral)   Resp 18   SpO2 99%   Visual Acuity Right Eye Distance:   Left Eye Distance:   Bilateral Distance:    Right Eye Near:   Left Eye Near:    Bilateral Near:     Physical Exam Vitals and nursing note reviewed.  Constitutional:      General: She is not in acute distress.    Appearance: She is well-developed. She is not ill-appearing.  HENT:     Head: Normocephalic and atraumatic.     Mouth/Throat:     Mouth: Mucous membranes are moist.     Dentition: Abnormal dentition. Dental tenderness and dental caries present.      Comments: Right lower broken and carious teeth.  Speech clear.  No difficulty swallowing.   Eyes:     Conjunctiva/sclera: Conjunctivae normal.  Cardiovascular:     Rate and Rhythm: Normal rate and regular rhythm.     Heart sounds: Normal heart sounds.  Pulmonary:     Effort: Pulmonary effort is normal. No respiratory distress.     Breath sounds: Normal breath sounds.  Abdominal:     Palpations: Abdomen is soft.     Tenderness: There is no abdominal tenderness.  Musculoskeletal:        General: Normal range of motion.     Cervical back: Neck supple.  Skin:    General: Skin is warm and dry.  Neurological:     General: No focal deficit present.     Mental Status: She is alert and oriented to person, place, and time.     Sensory: No sensory deficit.     Motor: No weakness.     Gait: Gait normal.     Comments: Upper extremities: Strength 5/5, sensation intact, 2+ pulses.  Psychiatric:        Mood and Affect: Mood normal.        Behavior: Behavior normal.      UC Treatments / Results  Labs (all labs ordered are listed, but only abnormal results are displayed) Labs Reviewed - No data to display  EKG   Radiology No results found.  Procedures Procedures (including critical care time)  Medications Ordered in UC Medications - No data to display  Initial Impression /  Assessment and Plan / UC Course  I have  reviewed the triage vital signs and the nursing notes.  Pertinent labs & imaging results that were available during my care of the patient were reviewed by me and considered in my medical decision making (see chart for details).   Pain due to dental caries and dental infection.  Intermittent left arm numbness.  EKG shows sinus rhythm, rate 65, no ST elevation, T wave inversions which were present on previous EKG from 2021 and 2018.  Patient currently does not have numbness in her left arm and denies cardiac symptoms.  Treating dental infection with amoxicillin and ibuprofen.  Instructed patient to schedule an appointment with a dentist as soon as possible; dental resource guide provided.  Strict ED precautions discussed for left arm numbness or any acute worsening symptoms.  She agrees to plan of care.   Final Clinical Impressions(s) / UC Diagnoses   Final diagnoses:  Pain due to dental caries  Dental infection  Left arm numbness     Discharge Instructions     Go to the emergency department if you have acute worsening symptoms, including numbness.    Take the antibiotic as prescribed.    A dental resource guide is attached.  Please call to make an appointment with a dentist as soon as possible.          ED Prescriptions    Medication Sig Dispense Auth. Provider   amoxicillin (AMOXIL) 875 MG tablet Take 1 tablet (875 mg total) by mouth 2 (two) times daily for 7 days. 14 tablet Wendee Beavers H, NP   ibuprofen (ADVIL) 600 MG tablet Take 1 tablet (600 mg total) by mouth every 6 (six) hours as needed. 30 tablet Mickie Bail, NP     I have reviewed the PDMP during this encounter.   Mickie Bail, NP 07/05/20 1212

## 2020-07-05 NOTE — ED Triage Notes (Signed)
Pt presents with dental pain, right ear pain, right sided jaw pain and numbness, intermittent numbness left ram x 5 days. Denies chest pain, weakness, vision changes, fever, chills.

## 2020-07-05 NOTE — Discharge Instructions (Addendum)
Go to the emergency department if you have acute worsening symptoms, including numbness.    Take the antibiotic as prescribed.    A dental resource guide is attached.  Please call to make an appointment with a dentist as soon as possible.

## 2020-07-15 ENCOUNTER — Encounter: Payer: Self-pay | Admitting: Family Medicine

## 2020-07-15 ENCOUNTER — Other Ambulatory Visit: Payer: Self-pay

## 2020-07-15 ENCOUNTER — Ambulatory Visit (INDEPENDENT_AMBULATORY_CARE_PROVIDER_SITE_OTHER): Payer: 59 | Admitting: Family Medicine

## 2020-07-15 VITALS — BP 134/84 | Ht 64.0 in | Wt 176.0 lb

## 2020-07-15 DIAGNOSIS — M25561 Pain in right knee: Secondary | ICD-10-CM

## 2020-07-15 NOTE — Patient Instructions (Signed)
Your exam is consistent with at least irritation to your medial meniscus. Start home exercises as directed. Icing 15 minutes at a time 3-4 times a day. Aleve 2 tabs twice a day with food OR ibuprofen 600mg  three times a day with food for pain and inflammation. See work note for details on restrictions. Knee brace or sleeve for support when up and walking around. Follow up with me in 6 weeks but call me sooner if you want to do physical therapy.  Your neck/arm pain is consistent with an irritated nerve.

## 2020-07-15 NOTE — Progress Notes (Signed)
PCP: Patient, No Pcp Per  Subjective:   HPI: Patient is a 35 y.o. female here for right knee pain.  Patient reports a few weeks ago she started to develop anterior right knee pain. No acute injury or trauma. Works with a Neurosurgeon with a lot of moving, turning. Feels like there's crunching sound anterior knee. No catching, locking. Some swelling.  Past Medical History:  Diagnosis Date  . Asthma   . Irregular intermenstrual bleeding     Current Outpatient Medications on File Prior to Visit  Medication Sig Dispense Refill  . drospirenone-ethinyl estradiol (YASMIN 28) 3-0.03 MG tablet Take 1 tablet by mouth daily. 28 tablet 2  . ibuprofen (ADVIL) 600 MG tablet Take 1 tablet (600 mg total) by mouth every 6 (six) hours as needed. 30 tablet 0  . tinidazole (TINDAMAX) 500 MG tablet Take 4 tablets (2,000 mg total) by mouth daily with breakfast. For two days 8 tablet 2  . [DISCONTINUED] ferrous sulfate 325 (65 FE) MG tablet Take 1 tablet (325 mg total) by mouth daily. (Patient not taking: Reported on 03/27/2019) 30 tablet 0  . [DISCONTINUED] medroxyPROGESTERone (PROVERA) 10 MG tablet Take 2 tablets (20 mg total) by mouth daily. For 30 days (Patient not taking: Reported on 01/07/2020) 60 tablet 2   No current facility-administered medications on file prior to visit.    Past Surgical History:  Procedure Laterality Date  . SKIN GRAFT Right    right hand    No Known Allergies  Social History   Socioeconomic History  . Marital status: Single    Spouse name: Not on file  . Number of children: Not on file  . Years of education: Not on file  . Highest education level: Not on file  Occupational History  . Not on file  Tobacco Use  . Smoking status: Never Smoker  . Smokeless tobacco: Never Used  Vaping Use  . Vaping Use: Never used  Substance and Sexual Activity  . Alcohol use: Not Currently  . Drug use: Not Currently    Types: Marijuana  . Sexual activity: Not Currently     Birth control/protection: None  Other Topics Concern  . Not on file  Social History Narrative  . Not on file   Social Determinants of Health   Financial Resource Strain: Not on file  Food Insecurity: Not on file  Transportation Needs: Not on file  Physical Activity: Not on file  Stress: Not on file  Social Connections: Not on file  Intimate Partner Violence: Not on file    Family History  Problem Relation Age of Onset  . Diabetes Mother   . Hypertension Mother     BP 134/84   Ht 5\' 4"  (1.626 m)   Wt 176 lb (79.8 kg)   BMI 30.21 kg/m   [facility] 12/18/2019  Frequency of aerobic exercise (# of days/week) 0  Average time in minutes 0  Frequency of strengthening activities (# of days/week) 0    No flowsheet data found.  Review of Systems: See HPI above.     Objective:  Physical Exam:  Gen: NAD, comfortable in exam room  Right knee: No gross deformity, ecchymoses, swelling. TTP medial joint line.  No other tenderness including patellar tendon. FROM with normal strength. Negative ant/post drawers. Negative valgus/varus testing. Negative lachman. Mild pain mcmurrays and thessalys.  Negative apleys. NV intact distally.   Assessment & Plan:  1. Right knee pain - consistent with irritation vs tear  medial meniscus.  Start home exercises.  Knee brace or sleeve for support.  Aleve of ibuprofen.  Restrictions at work.  She also noted pain left side of neck, arm that comes on about 6 times a day at work and is brief - discussed considering physical therapy - she will think about this.

## 2020-07-16 NOTE — Progress Notes (Signed)
Pt called asking for a new work letter without the restriction "no squatting, stooping or twisting."  New letter emailed to pt.

## 2020-08-26 ENCOUNTER — Encounter: Payer: Self-pay | Admitting: Family Medicine

## 2020-08-26 ENCOUNTER — Other Ambulatory Visit: Payer: Self-pay

## 2020-08-26 ENCOUNTER — Ambulatory Visit (INDEPENDENT_AMBULATORY_CARE_PROVIDER_SITE_OTHER): Payer: Managed Care, Other (non HMO) | Admitting: Family Medicine

## 2020-08-26 VITALS — BP 110/84 | Ht 64.0 in | Wt 176.0 lb

## 2020-08-26 DIAGNOSIS — M25561 Pain in right knee: Secondary | ICD-10-CM

## 2020-08-26 MED ORDER — METHYLPREDNISOLONE ACETATE 40 MG/ML IJ SUSP
40.0000 mg | Freq: Once | INTRAMUSCULAR | Status: AC
  Administered 2020-08-26: 40 mg via INTRA_ARTICULAR

## 2020-08-26 NOTE — Patient Instructions (Signed)
Your exam is consistent with at least irritation to your medial meniscus, possible flare of very mild arthritis. These are treated similarly. Continue home exercises. You were given an injection today. Icing 15 minutes at a time 3-4 times a day if needed. Aleve 2 tabs twice a day with food OR ibuprofen 600mg  three times a day with food for pain and inflammation. Knee brace or sleeve for support when up and walking around. Call me in a week or send me a mychart message to let me know how you're doing. Follow up in 6 weeks.

## 2020-08-26 NOTE — Progress Notes (Signed)
PCP: Patient, No Pcp Per (Inactive)  Subjective:   HPI: Patient is a 35 y.o. female here for follow up for right knee pain.  Continues to have right medial knee pain without significant improvement with conservative measures. Also has associated joint popping with squatting. Would like to pursue joint injection today for pain relief. No new injuries or trauma.  No locking.  Past Medical History:  Diagnosis Date  . Asthma   . Irregular intermenstrual bleeding     Current Outpatient Medications on File Prior to Visit  Medication Sig Dispense Refill  . drospirenone-ethinyl estradiol (YASMIN 28) 3-0.03 MG tablet Take 1 tablet by mouth daily. 28 tablet 2  . ibuprofen (ADVIL) 600 MG tablet Take 1 tablet (600 mg total) by mouth every 6 (six) hours as needed. 30 tablet 0  . tinidazole (TINDAMAX) 500 MG tablet Take 4 tablets (2,000 mg total) by mouth daily with breakfast. For two days 8 tablet 2  . [DISCONTINUED] ferrous sulfate 325 (65 FE) MG tablet Take 1 tablet (325 mg total) by mouth daily. (Patient not taking: Reported on 03/27/2019) 30 tablet 0  . [DISCONTINUED] medroxyPROGESTERone (PROVERA) 10 MG tablet Take 2 tablets (20 mg total) by mouth daily. For 30 days (Patient not taking: Reported on 01/07/2020) 60 tablet 2   No current facility-administered medications on file prior to visit.    Past Surgical History:  Procedure Laterality Date  . SKIN GRAFT Right    right hand    No Known Allergies  Social History   Socioeconomic History  . Marital status: Single    Spouse name: Not on file  . Number of children: Not on file  . Years of education: Not on file  . Highest education level: Not on file  Occupational History  . Not on file  Tobacco Use  . Smoking status: Never Smoker  . Smokeless tobacco: Never Used  Vaping Use  . Vaping Use: Never used  Substance and Sexual Activity  . Alcohol use: Not Currently  . Drug use: Not Currently    Types: Marijuana  . Sexual activity:  Not Currently    Birth control/protection: None  Other Topics Concern  . Not on file  Social History Narrative  . Not on file   Social Determinants of Health   Financial Resource Strain: Not on file  Food Insecurity: Not on file  Transportation Needs: Not on file  Physical Activity: Not on file  Stress: Not on file  Social Connections: Not on file  Intimate Partner Violence: Not on file    Family History  Problem Relation Age of Onset  . Diabetes Mother   . Hypertension Mother     BP 110/84   Ht 5\' 4"  (1.626 m)   Wt 176 lb (79.8 kg)   BMI 30.21 kg/m   [facility] 12/18/2019  Frequency of aerobic exercise (# of days/week) 0  Average time in minutes 0  Frequency of strengthening activities (# of days/week) 0    No flowsheet data found.  Review of Systems: See HPI above.     Objective:  Physical Exam:  Gen: NAD, comfortable in exam room No gross deformity, ecchymoses. Mild effusion present. Right medial joint line TTP. FROM with normal strength. Negative ant/post drawers. Negative valgus/varus testing. Negative lachman. Negative mcmurrays, apleys, thessalys. NV intact distally.  Assessment & Plan:  1. Right knee pain Ongoing medial joint line pain with exam consistent with possible meniscal irritation v. Early arthritis. Discussed pros and  cons for joint injection today and patient elected to purse. In addition we will do some conservative treatment beyond this visit.  - Knee inj as below - Continue home exercises - Ice, ibuprofen as needed - Knee brace for support - F/u in 6 weeks  Procedure Note: After informed written consent timeout was performed, patient was seated on exam table. Right knee was prepped with alcohol swab and utilizing anteromedial approach, patient's right knee was injected intraarticularly with 3:1 lidocaine: depomedrol. Patient tolerated the procedure well without immediate complications.

## 2020-10-02 ENCOUNTER — Other Ambulatory Visit: Payer: Self-pay | Admitting: Obstetrics & Gynecology

## 2020-10-02 ENCOUNTER — Other Ambulatory Visit: Payer: Self-pay | Admitting: Family Medicine

## 2020-10-02 DIAGNOSIS — L680 Hirsutism: Secondary | ICD-10-CM

## 2020-10-02 DIAGNOSIS — N939 Abnormal uterine and vaginal bleeding, unspecified: Secondary | ICD-10-CM

## 2020-10-07 ENCOUNTER — Other Ambulatory Visit: Payer: Self-pay

## 2020-10-07 ENCOUNTER — Ambulatory Visit (INDEPENDENT_AMBULATORY_CARE_PROVIDER_SITE_OTHER): Payer: Managed Care, Other (non HMO) | Admitting: Family Medicine

## 2020-10-07 ENCOUNTER — Encounter: Payer: Self-pay | Admitting: Family Medicine

## 2020-10-07 DIAGNOSIS — M659 Synovitis and tenosynovitis, unspecified: Secondary | ICD-10-CM | POA: Diagnosis not present

## 2020-10-07 DIAGNOSIS — M65962 Unspecified synovitis and tenosynovitis, left lower leg: Secondary | ICD-10-CM | POA: Insufficient documentation

## 2020-10-07 MED ORDER — DICLOFENAC SODIUM 75 MG PO TBEC: 75.0000 mg | DELAYED_RELEASE_TABLET | Freq: Two times a day (BID) | ORAL | 1 refills | Status: AC

## 2020-10-07 NOTE — Assessment & Plan Note (Signed)
Ultrasound showed left synovitis and effusion today. Recommended RICE. Offered steroid injection however pt would like to try diclofenac first as this has helped with her right knee. Prescribed diclofenac. Follow up 4-6 weeks if persistent sx.

## 2020-10-07 NOTE — Assessment & Plan Note (Deleted)
Ultrasound showed right synovitis and effusion today. Recommended RICE. Offered steroid injection however pt would like to try diclofenac first as this has helped with her right knee. Prescribed diclofenac. Follow up 4-6 weeks if persistent sx.

## 2020-10-07 NOTE — Progress Notes (Addendum)
    SUBJECTIVE:   CHIEF COMPLAINT / HPI:   Margia Wiesen is a 35 yr old female who presents with left knee pain  Left knee pain  Pt presents with knee pain over the last 2-3 weeks. 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 Knee pain is worse on squatting and going up and down the stairs. Denies injuries to the area. Has tried diclofenac for the pain which helps but she has now run out. She has still been able to work through the pain. She feels she may benefit from steroid injection.   PERTINENT  PMH / PSH: AUB  OBJECTIVE:   BP 112/86   Ht 5\' 4"  (1.626 m)   BMI 30.21 kg/m    Knee: - Inspection: no gross deformity. Mild effusion present. erythema or bruising. Skin intact - Palpation: TTP over quad tendon, suprapatellar pouch.  No joint line tenderness. - ROM: full active ROM with flexion and extension in knee and hip - Strength: 5/5 strength - Neuro/vasc: NV intact - Special Tests: - LIGAMENTS: negative anterior and posterior drawer, negative Lachman's, no MCL or LCL laxity  -- MENISCUS: negative McMurray's, negative Thessaly   Bedside showed synovitis and knee effusion of the left knee. No tendinopathy noted of quadriceps or patellar tendon.   ASSESSMENT/PLAN:   Synovitis of left knee Ultrasound showed left synovitis and effusion today. Recommended RICE. Offered steroid injection however pt would like to try diclofenac first as this has helped with her right knee. Prescribed diclofenac. Follow up 4-6 weeks if persistent sx.      [place], MD [facility]

## 2020-10-07 NOTE — Patient Instructions (Signed)
You have synovitis of your left knee. Icing 15 minutes at a time 3-4 times a day. Compression sleeve is very important - wear when at work and when up and walking around. Elevate above your heart as much as possible. Diclofenac 75mg  twice a day with food. Consider injection if you're not improving with the conservative treatment. Follow up with me in 5-6 weeks or as needed (we can see you sooner if you want the injection though).

## 2020-11-11 ENCOUNTER — Ambulatory Visit: Payer: Medicaid Other | Admitting: Family Medicine

## 2021-01-25 IMAGING — CR DG SHOULDER 2+V*L*
3 series · 3 of 3 positions shown · non-contrast
Comparison: None.

CLINICAL DATA: Left shoulder pain since 07/19/2019, fell off U
whole ramp. Patient reports scapular pain.

EXAM:
LEFT SHOULDER - 2+ VIEW

[shoulder grashey]
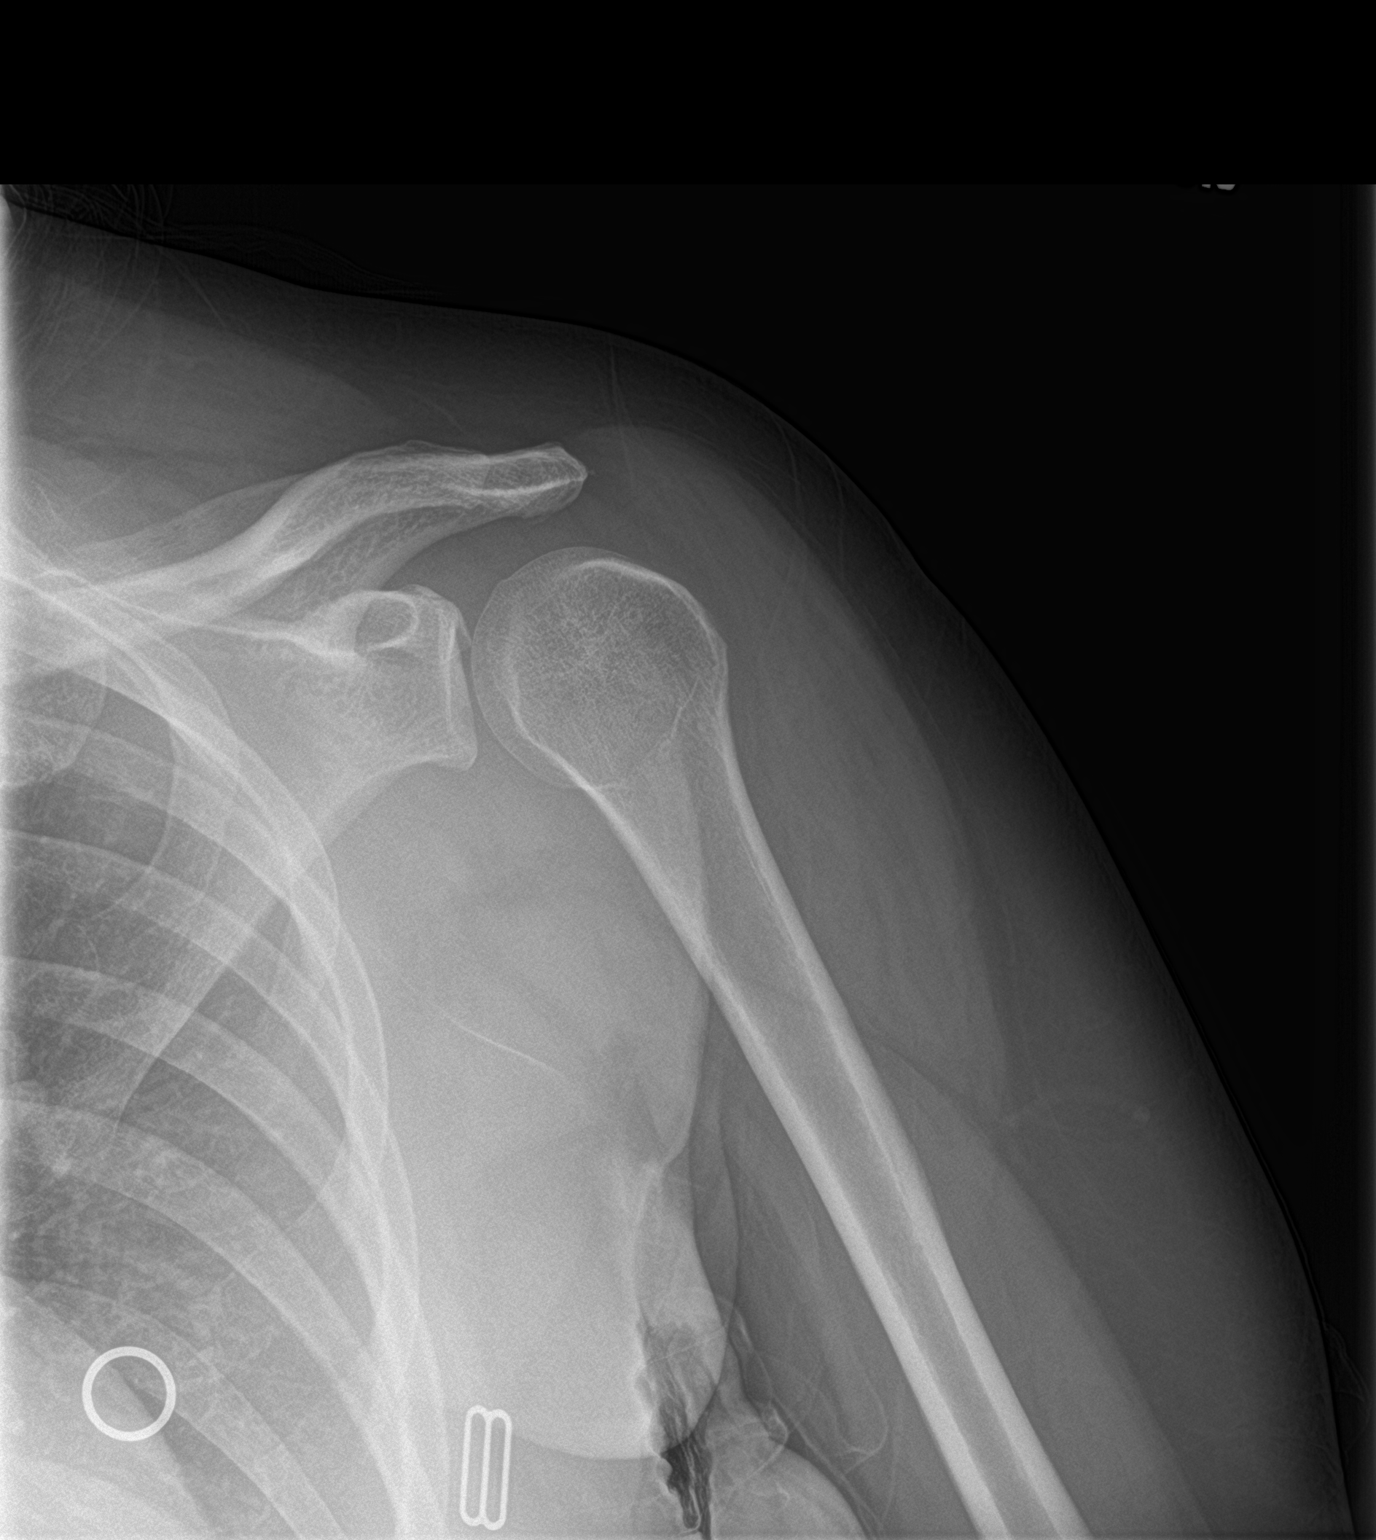

[shoulder y view]
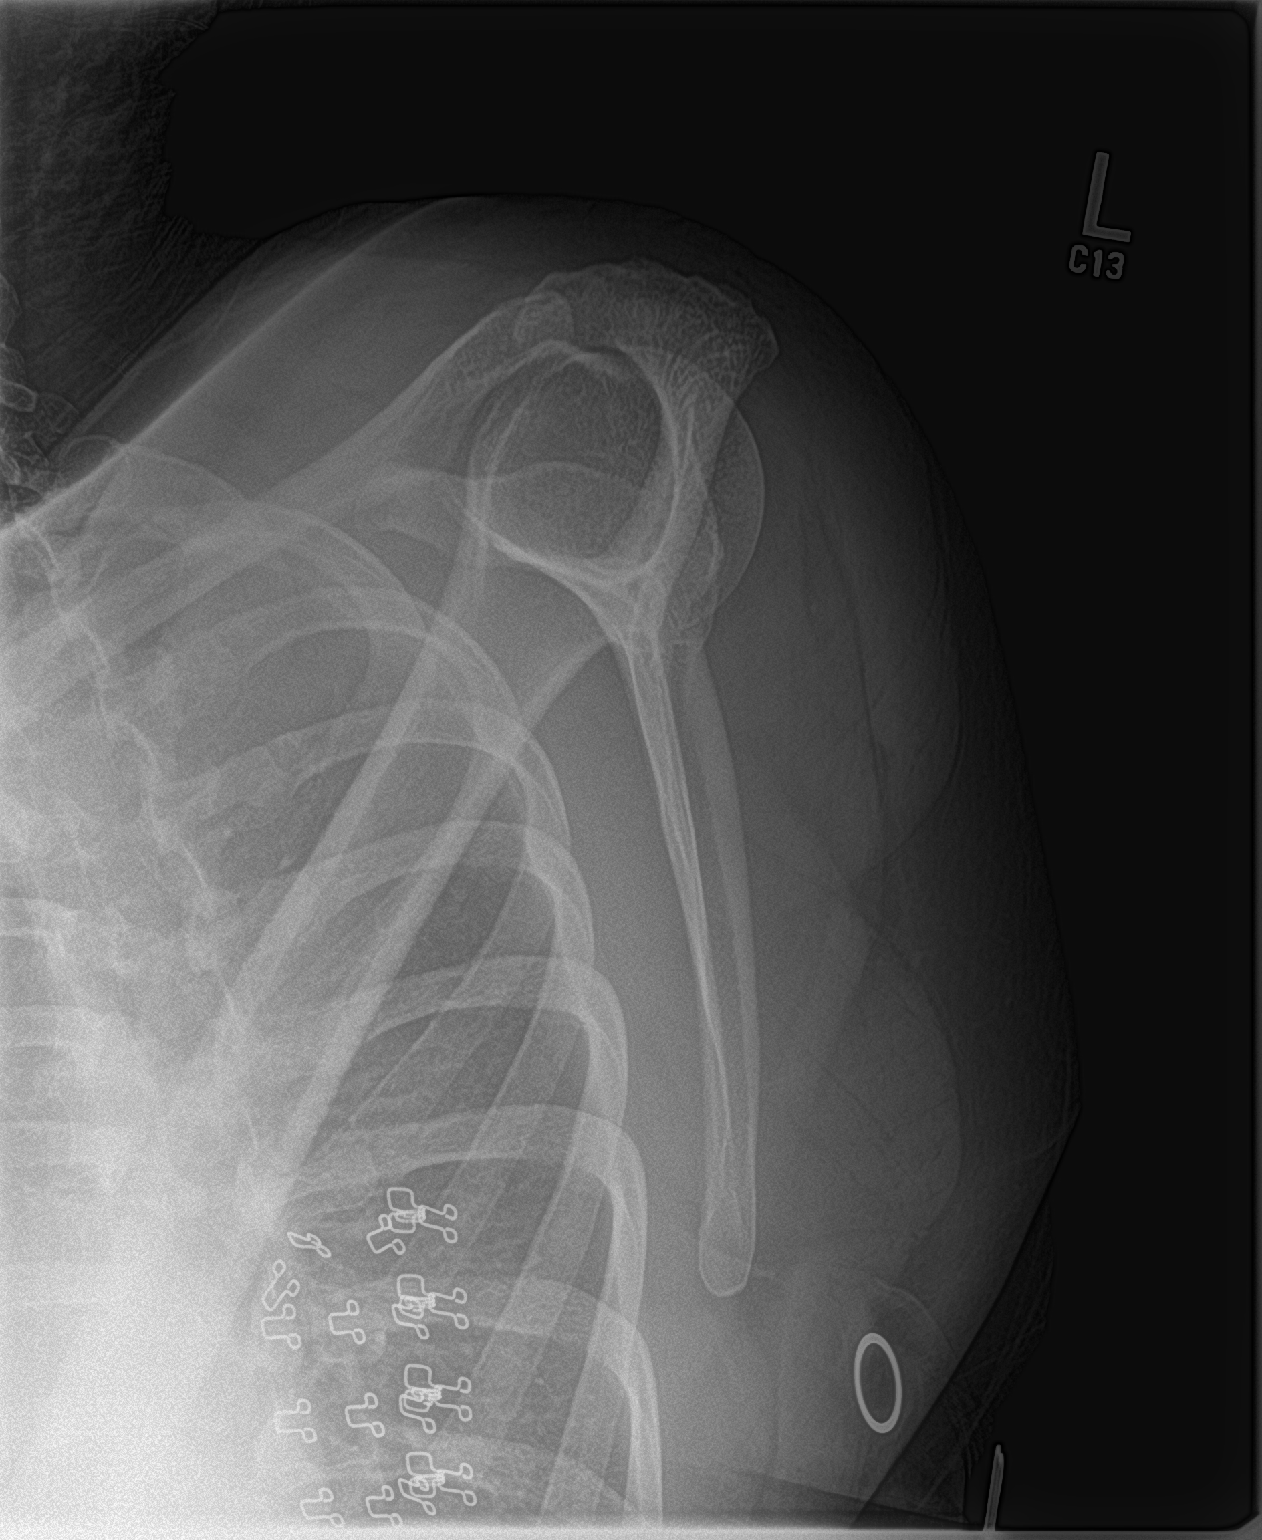

[shoulder axillary]
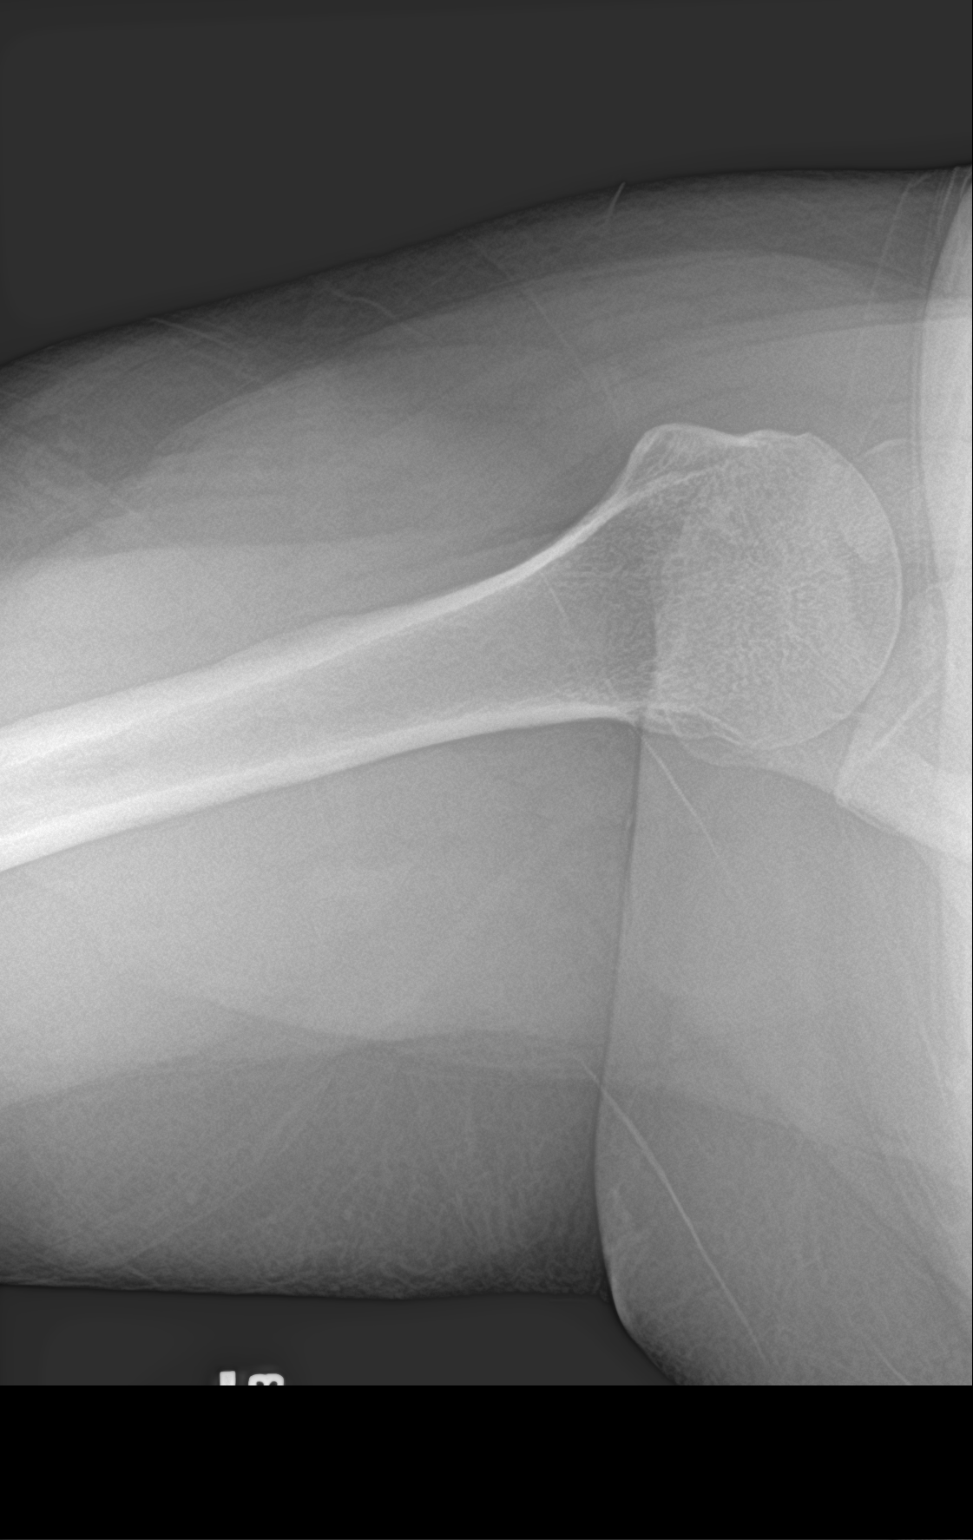

[3 of 3 positions shown; findings below may reference images not displayed]

FINDINGS: There is no evidence of fracture or dislocation. There is no
evidence of arthropathy or other focal bone abnormality. Soft
tissues are unremarkable.
IMPRESSION: Negative radiographs of the left shoulder.

## 2021-04-07 ENCOUNTER — Other Ambulatory Visit: Payer: Self-pay

## 2021-04-07 ENCOUNTER — Emergency Department (HOSPITAL_COMMUNITY)
Admission: EM | Admit: 2021-04-07 | Discharge: 2021-04-07 | Disposition: A | Discharge status: Home / Self Care | Payer: Medicaid Other | Attending: Emergency Medicine | Admitting: Emergency Medicine

## 2021-04-07 DIAGNOSIS — Z79899 Other long term (current) drug therapy: Secondary | ICD-10-CM | POA: Insufficient documentation

## 2021-04-07 DIAGNOSIS — J45909 Unspecified asthma, uncomplicated: Secondary | ICD-10-CM | POA: Insufficient documentation

## 2021-04-07 DIAGNOSIS — Z760 Encounter for issue of repeat prescription: Secondary | ICD-10-CM | POA: Insufficient documentation

## 2021-04-07 DIAGNOSIS — J4520 Mild intermittent asthma, uncomplicated: Secondary | ICD-10-CM | POA: Insufficient documentation

## 2021-04-07 MED ORDER — ALBUTEROL SULFATE HFA 108 (90 BASE) MCG/ACT IN AERS: 1.0000 | INHALATION_SPRAY | Freq: Four times a day (QID) | RESPIRATORY_TRACT | 6 refills | Status: AC | PRN

## 2021-04-07 NOTE — Discharge Instructions (Addendum)
Use inhaler as needed as prescribed.  Follow-up with your primary care provider.

## 2021-04-07 NOTE — ED Provider Notes (Signed)
[facility] EMERGENCY DEPARTMENT Provider Note   CSN: [id number] Arrival date & time: 04/07/21  5916     History Chief Complaint  Patient presents with   Shortness of Breath   Asthma    Donna Carroll is a 35 y.o. female.  35 year old female presents with complaint of shortness of breath x2 days.  Patient reports history of asthma, is out of her inhaler.  She denies fevers, chills, cough or URI symptoms.  No other complaints or concerns.      Past Medical History:  Diagnosis Date   Asthma    Irregular intermenstrual bleeding     Patient Active Problem List   Diagnosis Date Noted   Synovitis of left knee 10/07/2020   Abnormal uterine bleeding (AUB) 03/27/2019    Past Surgical History:  Procedure Laterality Date   SKIN GRAFT Right    right hand     OB History     Gravida  0   Para  0   Term  0   Preterm  0   AB  0   Living  0      SAB  0   IAB  0   Ectopic  0   Multiple  0   Live Births  0           Family History  Problem Relation Age of Onset   Diabetes Mother    Hypertension Mother     Social History   Tobacco Use   Smoking status: Never   Smokeless tobacco: Never  Vaping Use   Vaping Use: Never used  Substance Use Topics   Alcohol use: Not Currently   Drug use: Not Currently    Types: Marijuana    Home Medications Prior to Admission medications   Medication Sig Start Date End Date Taking? Authorizing Provider  albuterol (VENTOLIN HFA) 108 (90 Base) MCG/ACT inhaler Inhale 1-2 puffs into the lungs every 6 (six) hours as needed for wheezing or shortness of breath. 04/07/21  Yes Jeannie Fend, PA-C  diclofenac (VOLTAREN) 75 MG EC tablet Take 1 tablet (75 mg total) by mouth 2 (two) times daily. 10/07/20   Hudnall, Azucena Fallen, MD  drospirenone-ethinyl estradiol (YASMIN) 3-0.03 MG tablet Take 1 tablet by mouth once daily 10/05/20   Anyanwu, Jethro Bastos, MD  ibuprofen (ADVIL) 600 MG tablet Take 1  tablet (600 mg total) by mouth every 6 (six) hours as needed. 07/05/20   Mickie Bail, NP  tinidazole (TINDAMAX) 500 MG tablet Take 4 tablets (2,000 mg total) by mouth daily with breakfast. For two days 02/02/20   Anyanwu, Jethro Bastos, MD  ferrous sulfate 325 (65 FE) MG tablet Take 1 tablet (325 mg total) by mouth daily. Patient not taking: Reported on 03/27/2019 03/08/19 01/29/20  Georgetta Haber, NP  medroxyPROGESTERone (PROVERA) 10 MG tablet Take 2 tablets (20 mg total) by mouth daily. For 30 days Patient not taking: Reported on 01/07/2020 03/27/19 01/29/20  Adam Phenix, MD    Allergies    Patient has no known allergies.  Review of Systems   Review of Systems  Constitutional:  Negative for chills and fever.  HENT:  Negative for congestion and sore throat.   Respiratory:  Positive for shortness of breath. Negative for cough and wheezing.   Cardiovascular:  Negative for chest pain.  Gastrointestinal:  Negative for nausea and vomiting.  Musculoskeletal:  Negative for arthralgias and myalgias.  Skin:  Negative for rash and wound.  Allergic/Immunologic: Negative for immunocompromised state.  Neurological:  Negative for weakness.  Hematological:  Negative for adenopathy.  Psychiatric/Behavioral:  Negative for confusion.   All other systems reviewed and are negative.  Physical Exam Updated Vital Signs BP 113/81 (BP Location: Right Arm)    Pulse 91    Temp 98.2 F (36.8 C) (Oral)    Resp 16    SpO2 99%   Physical Exam Vitals and nursing note reviewed.  Constitutional:      General: She is not in acute distress.    Appearance: She is well-developed. She is not diaphoretic.  HENT:     Head: Normocephalic and atraumatic.  Cardiovascular:     Rate and Rhythm: Normal rate and regular rhythm.     Heart sounds: No murmur heard. Pulmonary:     Effort: Pulmonary effort is normal.     Breath sounds: Normal breath sounds. No decreased breath sounds or wheezing.  Skin:    General: Skin is  warm and dry.     Findings: No erythema or rash.  Neurological:     Mental Status: She is alert and oriented to person, place, and time.  Psychiatric:        Behavior: Behavior normal.    ED Results / Procedures / Treatments   Labs (all labs ordered are listed, but only abnormal results are displayed) Labs Reviewed - No data to display  EKG None  Radiology No results found.  Procedures Procedures   Medications Ordered in ED Medications - No data to display  ED Course  I have reviewed the triage vital signs and the nursing notes.  Pertinent labs & imaging results that were available during my care of the patient were reviewed by me and considered in my medical decision making (see chart for details).  Clinical Course as of 04/07/21 0751  Wed Apr 07, 2021  287 35 year old female with complaint of shortness of breath and need for inhaler refill.  Also requests work note.  Declines further work-up including chest x-ray.  Advised to follow-up with primary care provider and return to ER as needed. [LM]  [phone] Patient is well-appearing on exam, lungs clear to auscultation, vitals reviewed and reassuring including O2 sat 99% on room air. [LM]    Clinical Course User Index [LM] Alden Hipp   MDM Rules/Calculators/A&P                           Final Clinical Impression(s) / ED Diagnoses Final diagnoses:  Mild intermittent asthma, unspecified whether complicated  Medication refill    Rx / DC Orders ED Discharge Orders          Ordered    albuterol (VENTOLIN HFA) 108 (90 Base) MCG/ACT inhaler  Every 6 hours PRN        04/07/21 0749             Jeannie Fend, PA-C 04/07/21 0751    Virgina Norfolk, DO 04/07/21 [phone]

## 2021-04-07 NOTE — ED Triage Notes (Signed)
Patient arrives with complaints of shortness of breath x2 days. Patient report dyspnea with exertion and a history of asthma (no inhaler prescribed to use at home).   Ambulatory in triage.

## 2021-06-23 IMAGING — CR DG CHEST 2V
2 series · 2 of 2 positions shown · non-contrast
Comparison: July 12, 2016

CLINICAL DATA: Chest pain and shortness of breath.

EXAM:
CHEST - 2 VIEW

[chest pa]
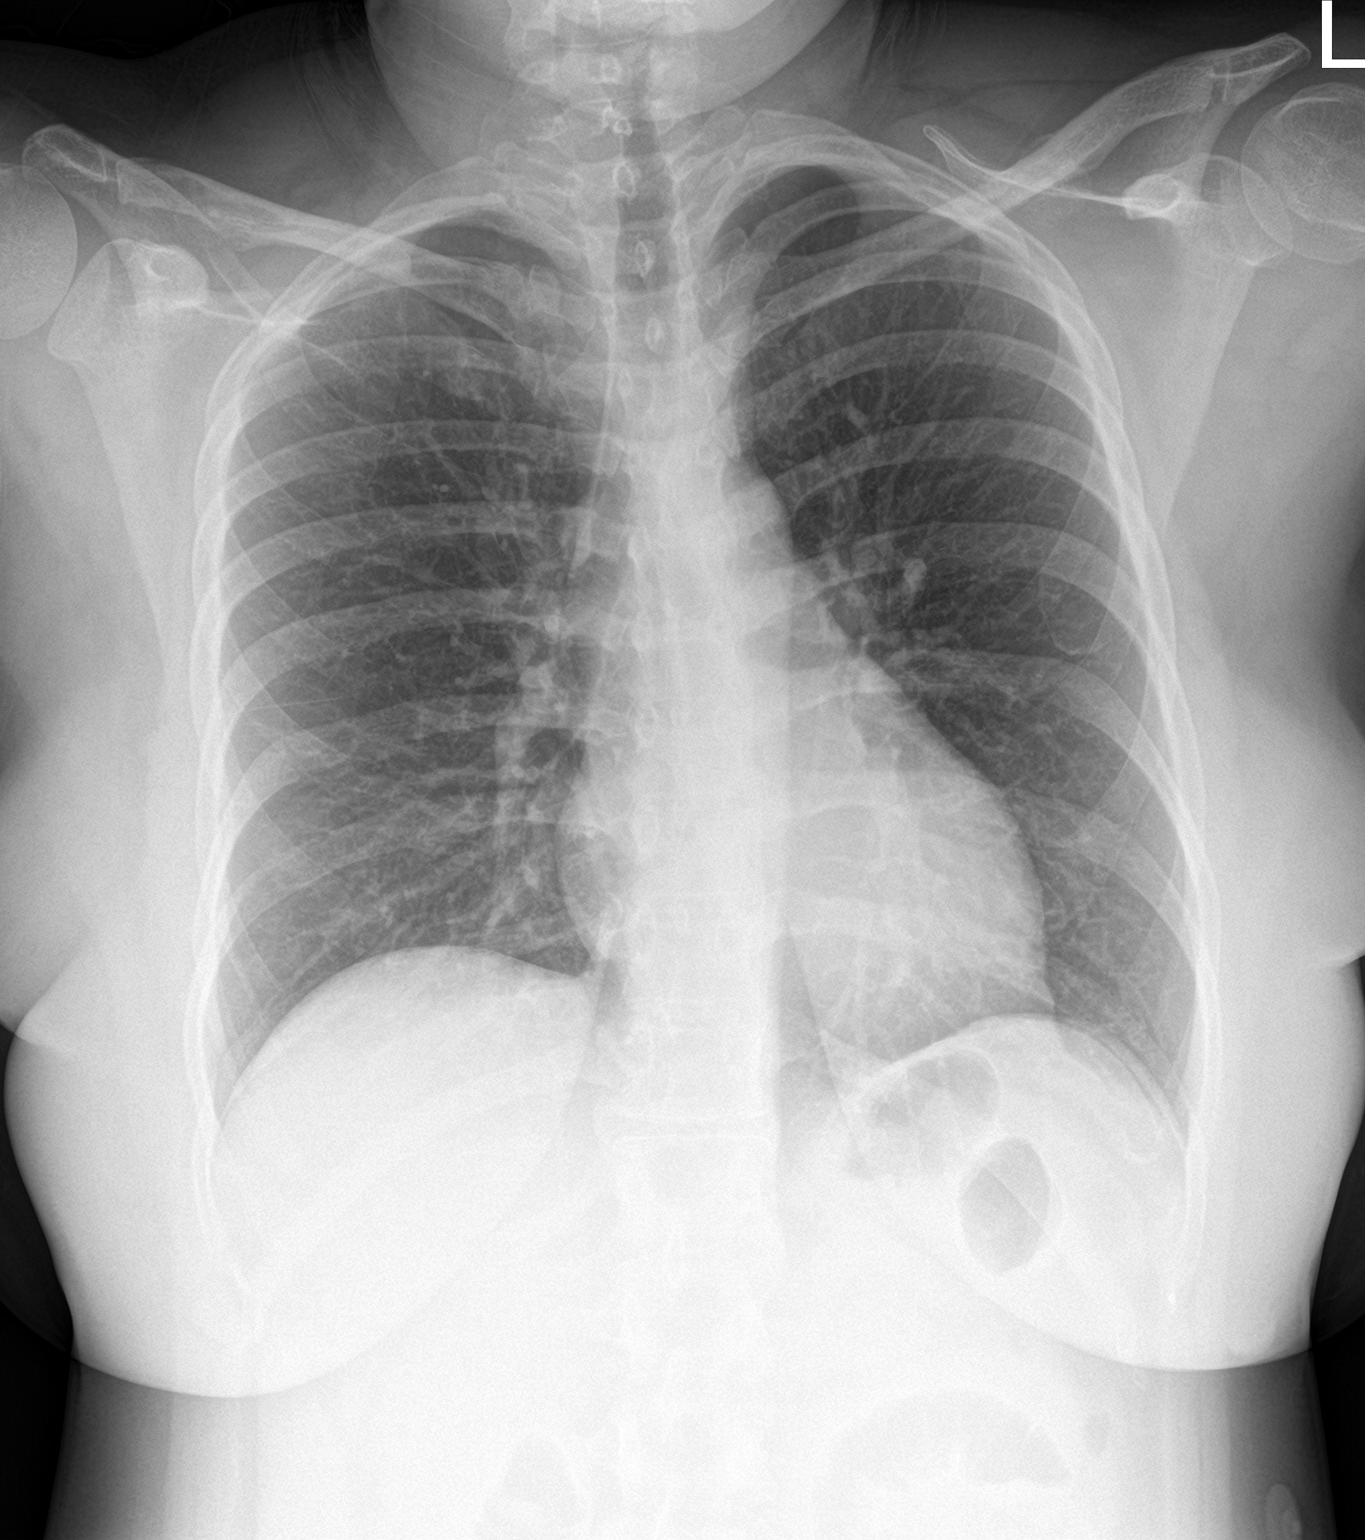

[chest lat]
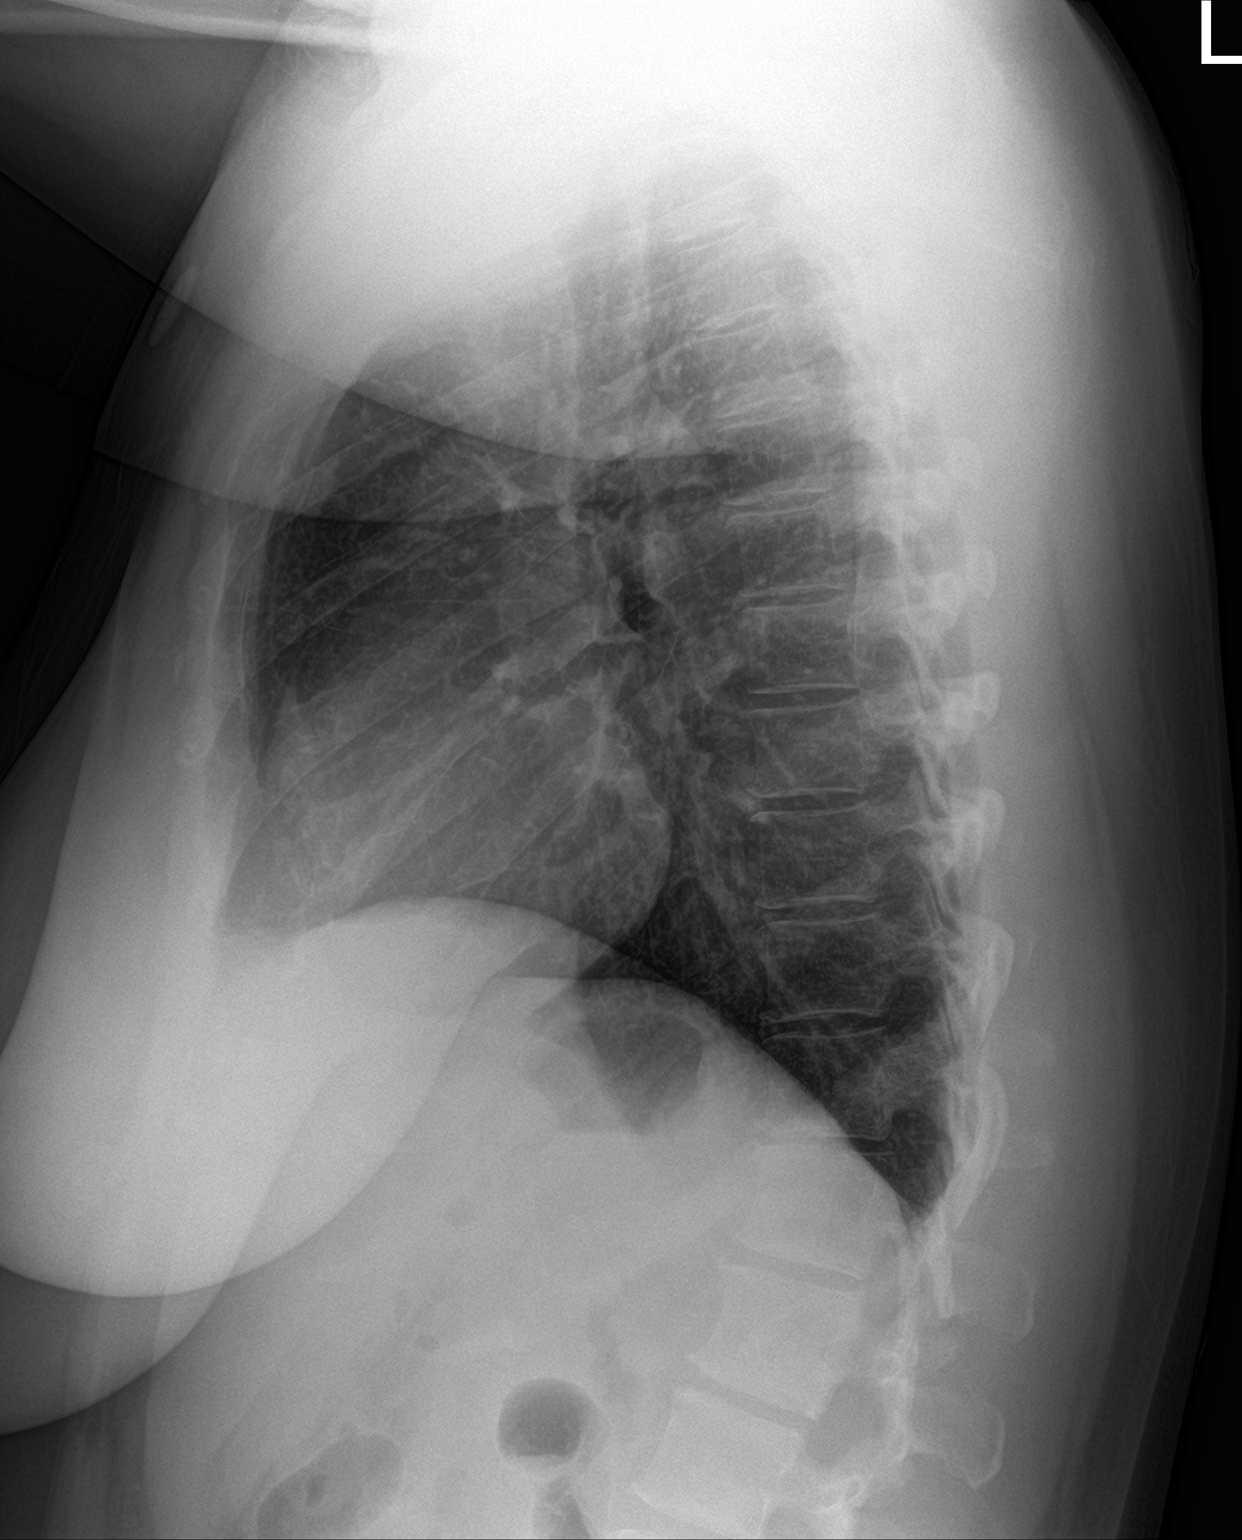

[2 of 2 positions shown; findings below may reference images not displayed]

FINDINGS: The heart size and mediastinal contours are within normal limits.
Both lungs are clear. The visualized skeletal structures are
unremarkable.
IMPRESSION: No active cardiopulmonary disease.

## 2022-04-15 ENCOUNTER — Telehealth: Payer: Self-pay

## 2022-04-15 NOTE — Telephone Encounter (Signed)
LVM. AS, CMA 

## 2022-06-07 DIAGNOSIS — H5213 Myopia, bilateral: Secondary | ICD-10-CM | POA: Diagnosis not present

## 2022-10-11 ENCOUNTER — Other Ambulatory Visit: Payer: Self-pay

## 2022-10-11 ENCOUNTER — Emergency Department (HOSPITAL_COMMUNITY)
Admission: EM | Admit: 2022-10-11 | Discharge: 2022-10-11 | Discharge status: Against Medical Advice | Payer: Medicaid Other | Attending: Emergency Medicine | Admitting: Emergency Medicine

## 2022-10-11 DIAGNOSIS — Z5321 Procedure and treatment not carried out due to patient leaving prior to being seen by health care provider: Secondary | ICD-10-CM | POA: Diagnosis not present

## 2022-10-11 DIAGNOSIS — Z23 Encounter for immunization: Secondary | ICD-10-CM | POA: Insufficient documentation

## 2022-10-11 NOTE — ED Notes (Signed)
Pt left. 

## 2022-10-11 NOTE — ED Triage Notes (Signed)
Patient notified this tech that she was still awaiting triage & that she was leaving the hospital. Patient stated that she would come back tomorrow morning to receive care. Triage RN has been notified.

## 2023-06-05 ENCOUNTER — Emergency Department (HOSPITAL_COMMUNITY)
Admission: EM | Admit: 2023-06-05 | Discharge: 2023-06-05 | Disposition: A | Discharge status: Home / Self Care | Payer: 59 | Attending: Emergency Medicine | Admitting: Emergency Medicine

## 2023-06-05 ENCOUNTER — Other Ambulatory Visit: Payer: Self-pay

## 2023-06-05 ENCOUNTER — Encounter (HOSPITAL_COMMUNITY): Payer: Self-pay | Admitting: Pharmacy Technician

## 2023-06-05 ENCOUNTER — Emergency Department (HOSPITAL_COMMUNITY): Payer: 59

## 2023-06-05 DIAGNOSIS — R202 Paresthesia of skin: Secondary | ICD-10-CM | POA: Diagnosis not present

## 2023-06-05 DIAGNOSIS — M25552 Pain in left hip: Secondary | ICD-10-CM | POA: Diagnosis not present

## 2023-06-05 DIAGNOSIS — M542 Cervicalgia: Secondary | ICD-10-CM | POA: Diagnosis not present

## 2023-06-05 DIAGNOSIS — M549 Dorsalgia, unspecified: Secondary | ICD-10-CM | POA: Insufficient documentation

## 2023-06-05 DIAGNOSIS — R29818 Other symptoms and signs involving the nervous system: Secondary | ICD-10-CM | POA: Diagnosis not present

## 2023-06-05 DIAGNOSIS — G8929 Other chronic pain: Secondary | ICD-10-CM | POA: Diagnosis not present

## 2023-06-05 DIAGNOSIS — R519 Headache, unspecified: Secondary | ICD-10-CM | POA: Diagnosis not present

## 2023-06-05 DIAGNOSIS — H538 Other visual disturbances: Secondary | ICD-10-CM | POA: Diagnosis not present

## 2023-06-05 LAB — CBC
HCT: 41 % (ref 36.0–46.0)
Hemoglobin: 13.3 g/dL (ref 12.0–15.0)
MCH: 27.1 pg (ref 26.0–34.0)
MCHC: 32.4 g/dL (ref 30.0–36.0)
MCV: 83.5 fL (ref 80.0–100.0)
Platelets: 353 10*3/uL (ref 150–400)
RBC: 4.91 MIL/uL (ref 3.87–5.11)
RDW: 15.5 % (ref 11.5–15.5)
WBC: 5 10*3/uL (ref 4.0–10.5)
nRBC: 0 % (ref 0.0–0.2)

## 2023-06-05 LAB — BASIC METABOLIC PANEL
Anion gap: 10 (ref 5–15)
BUN: 8 mg/dL (ref 6–20)
CO2: 25 mmol/L (ref 22–32)
Calcium: 9 mg/dL (ref 8.9–10.3)
Chloride: 103 mmol/L (ref 98–111)
Creatinine, Ser: 0.87 mg/dL (ref 0.44–1.00)
GFR, Estimated: >60 mL/min (ref >60)
Glucose, Bld: 90 mg/dL (ref 70–99)
Potassium: 3.8 mmol/L (ref 3.5–5.1)
Sodium: 138 mmol/L (ref 135–145)

## 2023-06-05 LAB — HCG, SERUM, QUALITATIVE: Preg, Serum: NEGATIVE

## 2023-06-05 MED ORDER — MELOXICAM 7.5 MG PO TABS: 15.0000 mg | ORAL_TABLET | Freq: Every day | ORAL | 0 refills | Status: AC

## 2023-06-05 MED ORDER — ONDANSETRON 4 MG PO TBDP
4.0000 mg | ORAL_TABLET | Freq: Once | ORAL | Status: AC
  Administered 2023-06-05: 4 mg via ORAL
  Filled 2023-06-05: qty 1

## 2023-06-05 NOTE — ED Provider Notes (Addendum)
[place] EMERGENCY DEPARTMENT AT [facility] Provider Note   CSN: [id number] Arrival date & time: 06/05/23  1513     History  Chief Complaint  Patient presents with   Neck Pain   Back Pain    Donna Carroll is a 38 y.o. female.  The history is provided by the patient and medical records.  Neck Pain Back Pain  38 y.o. F here with multiple concerns:   Headache-- started yesterday.  Seems to be like a tension type sensation, throbs a little in the back of her head.  She has had some blurred vision and tingling of her hands with this.  Denies nausea/vomiting,  some light sensitivity.  No hx of migraines.  Has had transient relief with tylenol.  Left hip pain-- ongoing for about a year now.  States no prior injury/trauma/fall so not entirely sure where this came from.  Some mild aching in the back but worse  in the actual hip.  Denies numbness/weakness of the legs.   No bowel or bladder incontinence.  Has missed work some because of this.  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  Home Medications Prior to Admission medications   Medication Sig Start Date End Date Taking? Authorizing Provider  albuterol (VENTOLIN HFA) 108 (90 Base) MCG/ACT inhaler Inhale 1-2 puffs into the lungs every 6 (six) hours as needed for wheezing or shortness of breath. 04/07/21   Jeannie Fend, PA-C  diclofenac (VOLTAREN) 75 MG EC tablet Take 1 tablet (75 mg total) by mouth 2 (two) times daily. 10/07/20   Hudnall, Azucena Fallen, MD  drospirenone-ethinyl estradiol (YASMIN) 3-0.03 MG tablet Take 1 tablet by mouth once daily 10/05/20   Anyanwu, Jethro Bastos, MD  ibuprofen (ADVIL) 600 MG tablet Take 1 tablet (600 mg total) by mouth every 6 (six) hours as needed. 07/05/20   Mickie Bail, NP  tinidazole (TINDAMAX) 500 MG tablet Take 4 tablets (2,000 mg total) by mouth daily with breakfast. For two days 02/02/20   Anyanwu, Jethro Bastos, MD  ferrous sulfate 325 (65 FE) MG tablet Take 1 tablet (325 mg  total) by mouth daily. Patient not taking: Reported on 03/27/2019 03/08/19 01/29/20  Georgetta Haber, NP  medroxyPROGESTERone (PROVERA) 10 MG tablet Take 2 tablets (20 mg total) by mouth daily. For 30 days Patient not taking: Reported on 01/07/2020 03/27/19 01/29/20  Adam Phenix, MD      Allergies    Patient has no known allergies.    Review of Systems   Review of Systems  Musculoskeletal:  Positive for arthralgias, back pain and neck pain.  All other systems reviewed and are negative.   Physical Exam Updated Vital Signs BP 129/82 (BP Location: Right Arm)   Pulse 86   Temp 99.9 F (37.7 C) (Oral)   Resp 16   LMP 05/09/2023 (Approximate)   SpO2 100%   Physical Exam Vitals and nursing note reviewed.  Constitutional:      General: She is not in acute distress.    Appearance: She is well-developed. She is not diaphoretic.  HENT:     Head: Normocephalic and atraumatic.     Right Ear: External ear normal.     Left Ear: External ear normal.  Eyes:     Conjunctiva/sclera: Conjunctivae normal.     Pupils: Pupils are equal, round, and reactive to light.  Neck:     Comments: No rigidity, no meningismus Cardiovascular:     Rate and Rhythm:  Normal rate and regular rhythm.     Heart sounds: Normal heart sounds. No murmur heard. Pulmonary:     Effort: Pulmonary effort is normal. No respiratory distress.     Breath sounds: Normal breath sounds. No wheezing or rhonchi.  Abdominal:     General: Bowel sounds are normal.     Palpations: Abdomen is soft.     Tenderness: There is no abdominal tenderness. There is no guarding.  Musculoskeletal:        General: Normal range of motion.     Cervical back: Full passive range of motion without pain, normal range of motion and neck supple. No rigidity.  Skin:    General: Skin is warm and dry.     Findings: No rash.  Neurological:     Mental Status: She is alert and oriented to person, place, and time.     Cranial Nerves: No cranial nerve  deficit.     Sensory: No sensory deficit.     Motor: No tremor or seizure activity.     Comments: AAOx3, answering questions and following commands appropriately; equal strength UE and LE bilaterally; CN grossly intact; moves all extremities appropriately without ataxia; no focal neuro deficits or facial asymmetry appreciated  Psychiatric:        Behavior: Behavior normal.        Thought Content: Thought content normal.     ED Results / Procedures / Treatments   Labs (all labs ordered are listed, but only abnormal results are displayed) Labs Reviewed  CBC  BASIC METABOLIC PANEL  HCG, SERUM, QUALITATIVE    EKG None  Radiology CT HEAD WO CONTRAST ( ) Result Date: 06/05/2023 CLINICAL DATA:  Headache. Neurological deficit. Blurred vision. Tingling of the hands. EXAM: CT HEAD WITHOUT CONTRAST TECHNIQUE: Contiguous axial images were obtained from the base of the skull through the vertex without intravenous contrast. RADIATION DOSE REDUCTION: This exam was performed according to the departmental dose-optimization program which includes automated exposure control, adjustment of the mA and/or kV according to patient size and/or use of iterative reconstruction technique. COMPARISON:  None Available. FINDINGS: Brain: The brain shows a normal appearance without evidence of malformation, atrophy, old or acute small or large vessel infarction, mass lesion, hemorrhage, hydrocephalus or extra-axial collection. Vascular: No hyperdense vessel. No evidence of atherosclerotic calcification. Skull: Normal.  No traumatic finding.  No focal bone lesion. Sinuses/Orbits: Sinuses are clear. Orbits appear normal. Mastoids are clear. Other: None significant IMPRESSION: Normal head CT. Electronically Signed   By: Paulina Fusi M.D.   On: 06/05/2023 23:20   DG Hip Unilat W or Wo Pelvis 2-3 Views Left Result Date: 06/05/2023 CLINICAL DATA:  Chronic left hip pain EXAM: DG HIP (WITH OR WITHOUT PELVIS) 3V LEFT COMPARISON:   None Available. FINDINGS: No fracture or dislocation. Preserved joint spaces and bone mineralization. There is some rounded calcifications in the pelvis which are indeterminate although possibly vascular. IMPRESSION: No acute osseous abnormality. Electronically Signed   By: Karen Kays M.D.   On: 06/05/2023 19:16    Procedures Procedures    Medications Ordered in ED Medications  ondansetron (ZOFRAN-ODT) disintegrating tablet 4 mg (4 mg Oral Given 06/05/23 1702)    ED Course/ Medical Decision Making/ A&P                                 Medical Decision Making Amount and/or Complexity of Data Reviewed Radiology: ordered and independent interpretation  performed. ECG/medicine tests: ordered and independent interpretation performed.  Risk Prescription drug management.   38 year old female here with multiple complaints.   Headache--ongoing since yesterday, tension-like with some throbbing in the back of her head.  Has had some light sensitivity.  Has had some mild relief with Tylenol.  Also reports some "tingling" in both of her hands.  She has not had any focal numbness or weakness.  She is able to hold and grasp objects normally.  She does not have any focal neurologic deficits on exam.  No meningeal signs.  CT head is negative.  Suspect she may have tension type headaches.  Left hip pain--ongoing for about a year.  No preceding injury, trauma, or fall.  Has missed work several times due to this.  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  She does not have any acute deformity or leg shortening on exam.  She remains ambulatory with steady gait.  X-ray is negative without advanced arthritic changes.  Will start on daily meloxicam to see if this helps.  Will refer to orthopedic surgery.  Work note given.  Can return here for new concerns.  Final Clinical Impression(s) / ED Diagnoses Final diagnoses:  Nonintractable headache, unspecified chronicity pattern,  unspecified headache type  Left hip pain    Rx / DC Orders ED Discharge Orders          Ordered    meloxicam (MOBIC) 7.5 MG tablet  Daily        06/05/23 2328              Garlon Hatchet, PA-C 06/06/23 0036    Garlon Hatchet, PA-C 06/06/23 0036    Rondel Baton, MD 06/10/23 [phone]

## 2023-06-05 NOTE — Discharge Instructions (Signed)
 Take the prescribed medication as directed. Follow-up with Dr. Charol Copas-- call for appt. Return to the ED for new or worsening symptoms.

## 2023-06-05 NOTE — ED Notes (Signed)
 Patient verbalizes understanding of discharge instructions. Opportunity for questioning and answers were provided. Armband removed by staff, pt discharged from ED. Pt ambulatory to ED waiting room with steady gait.

## 2023-06-05 NOTE — ED Triage Notes (Signed)
 Pt here with reports of back and neck aching for the last 2 days. Reports chest pain since yesterday along with cough.

## 2023-06-05 NOTE — ED Provider Triage Note (Signed)
 Emergency Medicine Provider Triage Evaluation Note  Donna Carroll , a 38 y.o. female  was evaluated in triage.  Pt complains of headache that feels like it is wrapping around her head and referred into her neck.  Reports pain is more of a tension or tightness feeling.  Does not typically get headaches.  Denies any recent injury trauma or fall.  No blurry vision change in vision lightheadedness.  She does report that she has associated sensitivity to light as well as nausea.   Also endorses 1 year of left hip pain, feels that pain causes weakness with hip flexion.  Review of Systems  Positive: HA, left hip pain  Negative: fever  Physical Exam  BP (!) 127/90 (BP Location: Left Arm)   Pulse 84   Temp 99.6 F (37.6 C) (Oral)   Resp 17   SpO2 100%  Gen:   Awake, no distress   Resp:  Normal effort  MSK:   Moves extremities without difficulty  Other:    Medical Decision Making  Medically screening exam initiated at 4:44 PM.  Appropriate orders placed.  PHILIP MOA was informed that the remainder of the evaluation will be completed by another provider, this initial triage assessment does not replace that evaluation, and the importance of remaining in the ED until their evaluation is complete.  Patient alert oriented and answering questions appropriately with no slurred speech.  Has reassuring neurological exam.   Eudora Heron, PA-C 06/05/23 1645

## 2024-05-20 ENCOUNTER — Encounter: Admitting: Physician Assistant

## 2024-07-01 ENCOUNTER — Encounter: Admitting: Family Medicine
